# Patient Record
Sex: Male | Born: 1940 | Race: White | Hispanic: No | Marital: Married | Smoking: Never smoker
Health system: Southern US, Community
[De-identification: ages and names within clinical notes are randomized; demographics above are authoritative.]

## PROBLEM LIST (undated history)

## (undated) DIAGNOSIS — C801 Malignant (primary) neoplasm, unspecified: Secondary | ICD-10-CM

## (undated) DIAGNOSIS — C61 Malignant neoplasm of prostate: Secondary | ICD-10-CM

## (undated) DIAGNOSIS — N400 Enlarged prostate without lower urinary tract symptoms: Secondary | ICD-10-CM

## (undated) DIAGNOSIS — I639 Cerebral infarction, unspecified: Secondary | ICD-10-CM

## (undated) DIAGNOSIS — I10 Essential (primary) hypertension: Secondary | ICD-10-CM

## (undated) DIAGNOSIS — C189 Malignant neoplasm of colon, unspecified: Secondary | ICD-10-CM

## (undated) DIAGNOSIS — K219 Gastro-esophageal reflux disease without esophagitis: Secondary | ICD-10-CM

## (undated) DIAGNOSIS — E785 Hyperlipidemia, unspecified: Secondary | ICD-10-CM

## (undated) DIAGNOSIS — R42 Dizziness and giddiness: Secondary | ICD-10-CM

## (undated) DIAGNOSIS — H9190 Unspecified hearing loss, unspecified ear: Secondary | ICD-10-CM

## (undated) DIAGNOSIS — F419 Anxiety disorder, unspecified: Secondary | ICD-10-CM

## (undated) DIAGNOSIS — L409 Psoriasis, unspecified: Secondary | ICD-10-CM

## (undated) DIAGNOSIS — M1990 Unspecified osteoarthritis, unspecified site: Secondary | ICD-10-CM

## (undated) HISTORY — PX: COLONOSCOPY: SHX174

## (undated) HISTORY — PX: HEMORRHOID SURGERY: SHX153

## (undated) HISTORY — PX: COLON SURGERY: SHX602

## (undated) HISTORY — PX: CIRCUMCISION: SUR203

## (undated) HISTORY — PX: TONSILLECTOMY: SUR1361

## (undated) HISTORY — PX: HERNIA REPAIR: SHX51

## (undated) HISTORY — PX: APPENDECTOMY: SHX54

## (undated) HISTORY — DX: Malignant neoplasm of colon, unspecified: C18.9

## (undated) HISTORY — PX: COLONOSCOPY W/ POLYPECTOMY: SHX1380

---

## 1998-01-13 HISTORY — PX: HEMORRHOID SURGERY: SHX153

## 2000-01-14 HISTORY — PX: HERNIA REPAIR: SHX51

## 2014-09-22 DIAGNOSIS — C4491 Basal cell carcinoma of skin, unspecified: Secondary | ICD-10-CM

## 2014-09-22 HISTORY — DX: Basal cell carcinoma of skin, unspecified: C44.91

## 2015-06-20 ENCOUNTER — Ambulatory Visit: Payer: Self-pay | Admitting: General Surgery

## 2015-06-22 DIAGNOSIS — C4492 Squamous cell carcinoma of skin, unspecified: Secondary | ICD-10-CM

## 2015-06-22 HISTORY — DX: Squamous cell carcinoma of skin, unspecified: C44.92

## 2015-12-24 ENCOUNTER — Encounter (HOSPITAL_COMMUNITY)
Admission: RE | Admit: 2015-12-24 | Discharge: 2015-12-24 | Disposition: A | Discharge status: Home / Self Care | Payer: Medicare Other | Source: Ambulatory Visit | Attending: General Surgery | Admitting: General Surgery

## 2015-12-24 ENCOUNTER — Encounter (HOSPITAL_COMMUNITY): Payer: Self-pay

## 2015-12-24 DIAGNOSIS — Z01812 Encounter for preprocedural laboratory examination: Secondary | ICD-10-CM | POA: Diagnosis present

## 2015-12-24 DIAGNOSIS — R9431 Abnormal electrocardiogram [ECG] [EKG]: Secondary | ICD-10-CM | POA: Diagnosis not present

## 2015-12-24 DIAGNOSIS — Z01810 Encounter for preprocedural cardiovascular examination: Secondary | ICD-10-CM | POA: Diagnosis present

## 2015-12-24 HISTORY — DX: Psoriasis, unspecified: L40.9

## 2015-12-24 HISTORY — DX: Dizziness and giddiness: R42

## 2015-12-24 HISTORY — DX: Gastro-esophageal reflux disease without esophagitis: K21.9

## 2015-12-24 HISTORY — DX: Unspecified hearing loss, unspecified ear: H91.90

## 2015-12-24 HISTORY — DX: Hyperlipidemia, unspecified: E78.5

## 2015-12-24 HISTORY — DX: Cerebral infarction, unspecified: I63.9

## 2015-12-24 HISTORY — DX: Benign prostatic hyperplasia without lower urinary tract symptoms: N40.0

## 2015-12-24 HISTORY — DX: Essential (primary) hypertension: I10

## 2015-12-24 HISTORY — DX: Anxiety disorder, unspecified: F41.9

## 2015-12-24 HISTORY — DX: Malignant (primary) neoplasm, unspecified: C80.1

## 2015-12-24 LAB — BASIC METABOLIC PANEL
Anion gap: 7 (ref 5–15)
BUN: 19 mg/dL (ref 6–20)
CHLORIDE: 104 mmol/L (ref 101–111)
CO2: 29 mmol/L (ref 22–32)
CREATININE: 1.13 mg/dL (ref 0.61–1.24)
Calcium: 9.5 mg/dL (ref 8.9–10.3)
GFR calc Af Amer: >60 mL/min (ref >60)
GLUCOSE: 114 mg/dL — AB (ref 65–99)
POTASSIUM: 3.8 mmol/L (ref 3.5–5.1)
Sodium: 140 mmol/L (ref 135–145)

## 2015-12-24 LAB — CBC
HEMATOCRIT: 41.3 % (ref 39.0–52.0)
Hemoglobin: 13.9 g/dL (ref 13.0–17.0)
MCH: 31.2 pg (ref 26.0–34.0)
MCHC: 33.7 g/dL (ref 30.0–36.0)
MCV: 92.8 fL (ref 78.0–100.0)
Platelets: 169 10*3/uL (ref 150–400)
RBC: 4.45 MIL/uL (ref 4.22–5.81)
RDW: 12.4 % (ref 11.5–15.5)
WBC: 4.8 10*3/uL (ref 4.0–10.5)

## 2015-12-24 NOTE — Pre-Procedure Instructions (Signed)
    Ferd Musa  12/24/2015     Your procedure is scheduled on Monday, December 18.  Report to [facility] Admitting at [place] AM               Your surgery or procedure is scheduled for 7:30 AM   Call this number if you have problems the morning of surgery: [phone]                For any other questions, please call [phone], Monday - Friday 8 AM - 4 PM.  Remember:  Do not eat food or drink liquids after midnight Sunday, December 17.  Take these medicines the morning of surgery with A SIP OF WATER: diltiazem (DILACOR XR), doxazosin (CARDURA).               1 Week prior to surgery STOP taking Aspirin, Aspirin Products (Goody Powder, Excedrin Migraine), Ibuprofen (Advil), Naproxen (Aleve), Vitamins and Herbal Products (ie Fish Oil)   Do not wear jewelry, make-up or nail polish.  Do not wear lotions, powders, or perfumes, or deoderant.  Men may shave face and neck.  Do not bring valuables to the hospital.  [facility] is not responsible for any belongings or valuables.  Contacts, dentures or bridgework may not be worn into surgery.  Leave your suitcase in the car.  After surgery it may be brought to your room.  For patients admitted to the hospital, discharge time will be determined by your treatment team.  Patients discharged the day of surgery will not be allowed to drive home.   Name and phone number of your driver: -  Special instructions:  Review  [place] - Preparing For Surgery.  Please read over the following fact sheets that you were given. [place]- Preparing For Surger , Coughing and Deep BreathingPain Booklet

## 2015-12-24 NOTE — Progress Notes (Signed)
   12/24/15 1056  OBSTRUCTIVE SLEEP APNEA  Have you ever been diagnosed with sleep apnea through a sleep study? No  Do you snore loudly (loud enough to be heard through closed doors)?  1 (sometimes)  Do you often feel tired, fatigued, or sleepy during the daytime (such as falling asleep during driving or talking to someone)? 1  Has anyone observed you stop breathing during your sleep? 0  Do you have, or are you being treated for high blood pressure? 1  BMI more than 35 kg/m2? 0  Age > 50 (1-yes) 1  Neck circumference greater than:Male 16 inches or larger, Male 17inches or larger? 0 (15.5)  Male Gender (Yes=1) 1  Obstructive Sleep Apnea Score 5  Score 5 or greater  Results sent to PCP

## 2015-12-24 NOTE — Progress Notes (Signed)
Mr Painter denies shortness of breath at rest or chest pain. PCP is at [facility], I requested records. Denies Echo or Stress test.

## 2015-12-25 ENCOUNTER — Encounter (HOSPITAL_COMMUNITY): Payer: Self-pay

## 2015-12-26 ENCOUNTER — Ambulatory Visit: Payer: Self-pay | Admitting: General Surgery

## 2015-12-31 ENCOUNTER — Ambulatory Visit (HOSPITAL_COMMUNITY)
Admission: RE | Admit: 2015-12-31 | Discharge: 2015-12-31 | Disposition: A | Discharge status: Home / Self Care | Payer: Medicare Other | Source: Ambulatory Visit | Attending: General Surgery | Admitting: General Surgery

## 2015-12-31 ENCOUNTER — Encounter (HOSPITAL_COMMUNITY): Payer: Self-pay | Admitting: Anesthesiology

## 2015-12-31 ENCOUNTER — Encounter (HOSPITAL_COMMUNITY)
Admission: RE | Disposition: A | Discharge status: Home / Self Care | Payer: Self-pay | Source: Ambulatory Visit | Attending: General Surgery

## 2015-12-31 ENCOUNTER — Ambulatory Visit (HOSPITAL_COMMUNITY): Payer: Medicare Other | Admitting: Emergency Medicine

## 2015-12-31 ENCOUNTER — Ambulatory Visit (HOSPITAL_COMMUNITY): Payer: Medicare Other | Admitting: Anesthesiology

## 2015-12-31 DIAGNOSIS — Z79899 Other long term (current) drug therapy: Secondary | ICD-10-CM | POA: Diagnosis not present

## 2015-12-31 DIAGNOSIS — K219 Gastro-esophageal reflux disease without esophagitis: Secondary | ICD-10-CM | POA: Diagnosis not present

## 2015-12-31 DIAGNOSIS — I10 Essential (primary) hypertension: Secondary | ICD-10-CM | POA: Insufficient documentation

## 2015-12-31 DIAGNOSIS — F419 Anxiety disorder, unspecified: Secondary | ICD-10-CM | POA: Diagnosis not present

## 2015-12-31 DIAGNOSIS — K4090 Unilateral inguinal hernia, without obstruction or gangrene, not specified as recurrent: Secondary | ICD-10-CM | POA: Diagnosis present

## 2015-12-31 HISTORY — PX: INGUINAL HERNIA REPAIR: SHX194

## 2015-12-31 HISTORY — PX: INSERTION OF MESH: SHX5868

## 2015-12-31 SURGERY — REPAIR, HERNIA, INGUINAL, ADULT
Anesthesia: Regional | Site: Groin | Laterality: Left

## 2015-12-31 MED ORDER — FENTANYL CITRATE (PF) 100 MCG/2ML IJ SOLN: 25.0000 ug | INTRAMUSCULAR | Status: DC | PRN

## 2015-12-31 MED ORDER — SUGAMMADEX SODIUM 200 MG/2ML IV SOLN
INTRAVENOUS | Status: DC | PRN
  Administered 2015-12-31: 150 mg via INTRAVENOUS

## 2015-12-31 MED ORDER — 0.9 % SODIUM CHLORIDE (POUR BTL) OPTIME
TOPICAL | Status: DC | PRN
  Administered 2015-12-31: 1000 mL

## 2015-12-31 MED ORDER — PHENYLEPHRINE 40 MCG/ML (10ML) SYRINGE FOR IV PUSH (FOR BLOOD PRESSURE SUPPORT)
PREFILLED_SYRINGE | INTRAVENOUS | Status: AC
  Filled 2015-12-31: qty 10

## 2015-12-31 MED ORDER — ONDANSETRON HCL 4 MG/2ML IJ SOLN
INTRAMUSCULAR | Status: AC
  Filled 2015-12-31: qty 2

## 2015-12-31 MED ORDER — FENTANYL CITRATE (PF) 100 MCG/2ML IJ SOLN
INTRAMUSCULAR | Status: DC | PRN
  Administered 2015-12-31: 50 ug via INTRAVENOUS
  Administered 2015-12-31: 100 ug via INTRAVENOUS

## 2015-12-31 MED ORDER — PHENYLEPHRINE HCL 10 MG/ML IJ SOLN
INTRAMUSCULAR | Status: DC | PRN
  Administered 2015-12-31 (×4): 80 ug via INTRAVENOUS

## 2015-12-31 MED ORDER — SUGAMMADEX SODIUM 200 MG/2ML IV SOLN
INTRAVENOUS | Status: AC
  Filled 2015-12-31: qty 2

## 2015-12-31 MED ORDER — FENTANYL CITRATE (PF) 100 MCG/2ML IJ SOLN
INTRAMUSCULAR | Status: AC
  Filled 2015-12-31: qty 4

## 2015-12-31 MED ORDER — CHLORHEXIDINE GLUCONATE CLOTH 2 % EX PADS: 6.0000 | MEDICATED_PAD | Freq: Once | CUTANEOUS | Status: DC

## 2015-12-31 MED ORDER — MIDAZOLAM HCL 5 MG/5ML IJ SOLN
INTRAMUSCULAR | Status: DC | PRN
  Administered 2015-12-31: 1 mg via INTRAVENOUS

## 2015-12-31 MED ORDER — LIDOCAINE HCL (CARDIAC) 20 MG/ML IV SOLN
INTRAVENOUS | Status: DC | PRN
  Administered 2015-12-31: 60 mg via INTRAVENOUS

## 2015-12-31 MED ORDER — OXYCODONE HCL 5 MG PO TABS: 5.0000 mg | ORAL_TABLET | Freq: Four times a day (QID) | ORAL | 0 refills | Status: DC | PRN

## 2015-12-31 MED ORDER — ONDANSETRON HCL 4 MG/2ML IJ SOLN
INTRAMUSCULAR | Status: DC | PRN
  Administered 2015-12-31: 4 mg via INTRAVENOUS

## 2015-12-31 MED ORDER — PROPOFOL 10 MG/ML IV BOLUS
INTRAVENOUS | Status: DC | PRN
  Administered 2015-12-31: 150 mg via INTRAVENOUS

## 2015-12-31 MED ORDER — BUPIVACAINE-EPINEPHRINE (PF) 0.25% -1:200000 IJ SOLN
INTRAMUSCULAR | Status: AC
  Filled 2015-12-31: qty 30

## 2015-12-31 MED ORDER — CIPROFLOXACIN IN D5W 400 MG/200ML IV SOLN
400.0000 mg | INTRAVENOUS | Status: AC
  Administered 2015-12-31: 400 mg via INTRAVENOUS
  Filled 2015-12-31: qty 200

## 2015-12-31 MED ORDER — PROPOFOL 10 MG/ML IV BOLUS
INTRAVENOUS | Status: AC
  Filled 2015-12-31: qty 20

## 2015-12-31 MED ORDER — BUPIVACAINE-EPINEPHRINE 0.25% -1:200000 IJ SOLN
INTRAMUSCULAR | Status: DC | PRN
  Administered 2015-12-31: 10 mL

## 2015-12-31 MED ORDER — LIDOCAINE 2% (20 MG/ML) 5 ML SYRINGE
INTRAMUSCULAR | Status: AC
  Filled 2015-12-31: qty 5

## 2015-12-31 MED ORDER — EPHEDRINE SULFATE 50 MG/ML IJ SOLN
INTRAMUSCULAR | Status: DC | PRN
  Administered 2015-12-31 (×2): 10 mg via INTRAVENOUS

## 2015-12-31 MED ORDER — ROCURONIUM BROMIDE 50 MG/5ML IV SOSY
PREFILLED_SYRINGE | INTRAVENOUS | Status: AC
  Filled 2015-12-31: qty 5

## 2015-12-31 MED ORDER — ROCURONIUM BROMIDE 100 MG/10ML IV SOLN
INTRAVENOUS | Status: DC | PRN
  Administered 2015-12-31: 40 mg via INTRAVENOUS

## 2015-12-31 MED ORDER — LACTATED RINGERS IV SOLN
INTRAVENOUS | Status: DC | PRN
  Administered 2015-12-31 (×2): via INTRAVENOUS

## 2015-12-31 MED ORDER — MIDAZOLAM HCL 2 MG/2ML IJ SOLN
INTRAMUSCULAR | Status: AC
  Filled 2015-12-31: qty 2

## 2015-12-31 SURGICAL SUPPLY — 43 items
BLADE SURG 10 STRL SS (BLADE) ×3 IMPLANT
BLADE SURG 15 STRL LF DISP TIS (BLADE) ×1 IMPLANT
BLADE SURG 15 STRL SS (BLADE) ×2
BLADE SURG ROTATE 9660 (MISCELLANEOUS) ×3 IMPLANT
CHLORAPREP W/TINT 26ML (MISCELLANEOUS) ×3 IMPLANT
COVER SURGICAL LIGHT HANDLE (MISCELLANEOUS) ×3 IMPLANT
DERMABOND ADVANCED (GAUZE/BANDAGES/DRESSINGS) ×2
DERMABOND ADVANCED .7 DNX12 (GAUZE/BANDAGES/DRESSINGS) ×1 IMPLANT
DRAIN PENROSE 1/2X12 LTX STRL (WOUND CARE) ×3 IMPLANT
DRAPE LAPAROTOMY 100X72 PEDS (DRAPES) ×3 IMPLANT
DRAPE UTILITY XL STRL (DRAPES) ×3 IMPLANT
ELECT CAUTERY BLADE 6.4 (BLADE) ×3 IMPLANT
ELECT REM PT RETURN 9FT ADLT (ELECTROSURGICAL) ×3
ELECTRODE REM PT RTRN 9FT ADLT (ELECTROSURGICAL) ×1 IMPLANT
GLOVE BIO SURGEON STRL SZ8 (GLOVE) ×3 IMPLANT
GLOVE BIOGEL PI IND STRL 8 (GLOVE) ×1 IMPLANT
GLOVE BIOGEL PI INDICATOR 8 (GLOVE) ×2
GOWN STRL REUS W/ TWL LRG LVL3 (GOWN DISPOSABLE) ×1 IMPLANT
GOWN STRL REUS W/ TWL XL LVL3 (GOWN DISPOSABLE) ×1 IMPLANT
GOWN STRL REUS W/TWL LRG LVL3 (GOWN DISPOSABLE) ×2
GOWN STRL REUS W/TWL XL LVL3 (GOWN DISPOSABLE) ×2
KIT BASIN OR (CUSTOM PROCEDURE TRAY) ×3 IMPLANT
KIT ROOM TURNOVER OR (KITS) ×3 IMPLANT
MESH HERNIA 3X6 (Mesh General) ×3 IMPLANT
NEEDLE 22X1 1/2 (OR ONLY) (NEEDLE) ×3 IMPLANT
NS IRRIG 1000ML POUR BTL (IV SOLUTION) ×3 IMPLANT
PACK SURGICAL SETUP 50X90 (CUSTOM PROCEDURE TRAY) ×3 IMPLANT
PAD ARMBOARD 7.5X6 YLW CONV (MISCELLANEOUS) ×3 IMPLANT
PENCIL BUTTON HOLSTER BLD 10FT (ELECTRODE) ×3 IMPLANT
SPONGE LAP 18X18 X RAY DECT (DISPOSABLE) ×3 IMPLANT
SUT MNCRL AB 4-0 PS2 18 (SUTURE) ×3 IMPLANT
SUT PROLENE 2 0 CT2 30 (SUTURE) ×12 IMPLANT
SUT SILK 3 0 TIES 10X30 (SUTURE) ×3 IMPLANT
SUT VIC AB 2-0 SH 27 (SUTURE) ×4
SUT VIC AB 2-0 SH 27X BRD (SUTURE) ×2 IMPLANT
SUT VIC AB 3-0 SH 27 (SUTURE) ×2
SUT VIC AB 3-0 SH 27X BRD (SUTURE) ×1 IMPLANT
SYR BULB 3OZ (MISCELLANEOUS) ×3 IMPLANT
SYR CONTROL 10ML LL (SYRINGE) ×3 IMPLANT
TOWEL OR 17X24 6PK STRL BLUE (TOWEL DISPOSABLE) ×3 IMPLANT
TUBE CONNECTING 12'X1/4 (SUCTIONS) ×1
TUBE CONNECTING 12X1/4 (SUCTIONS) ×2 IMPLANT
YANKAUER SUCT BULB TIP NO VENT (SUCTIONS) ×3 IMPLANT

## 2015-12-31 NOTE — Op Note (Signed)
12/31/2015  8:32 AM  PATIENT:  Chad Ford  75 y.o. male  PRE-OPERATIVE DIAGNOSIS:  LEFT INGUINAL HERNIA  POST-OPERATIVE DIAGNOSIS:  LEFT INGUINAL HERNIA  PROCEDURE:  Procedure(s): HERNIA REPAIR LEFT INGUINAL ADULT INSERTION OF MESH  SURGEON:  Surgeon(s): Georganna Skeans, MD  ASSISTANTS: none   ANESTHESIA:   local, regional and general  EBL:  Total I/O In: 1300 [I.V.:1300] Out: -   BLOOD ADMINISTERED:none  DRAINS: none   SPECIMEN:  No Specimen  DISPOSITION OF SPECIMEN:  N/A  COUNTS:  YES  DICTATION: .Dragon Dictation Findings: Large direct hernia  Procedure in detail: Dace presents for repair of left inguinal hernia with mesh. He was identified in the preop holding area. Informed consent was obtained. He underwent a tap block by anesthesia. He was brought to the operating room and general endotracheal anesthesia was administered per the anesthesia staff. His abdomen was prepped and draped in sterile fashion. Time out procedure was performed. Planned line of incision in the left inguinal region was treated with local. Left inguinal incision was made. Subcutaneous tissues were dissected revealing the external oblique fascia. Hemostasis was obtained dissection with cautery. External oblique fascia was divided laterally in the division was continued down medially over the hernia. Superior leaflet of the external oblique was dissected off the transversalis. Inferior leaflet was dissected down revealing the shelving edge of inguinal ligament. Dissection of the cord structures revealed the hernia to be a large direct hernia. This easily reduced back into the abdomen. Several interrupted 2-0 Vicryl sutures were placed between the transversalis and the shelving edge of inguinal ligament to keep the direct hernia reduced while the repair was completed. Repair was done with a keyhole polypropylene mesh cut to custom size and shape. This was tacked to the tissues over the pubic  tubercle medially with 2-0 Prolene. Running 2-0 Prolene was used to sew it to the shelving edge of inguinal ligament inferiorly. Superiorly multiple interrupted 2-0 Prolene's were used to tack the mesh down to the transversalis and the tissues over the pubic tubercle. The 2 leaflets of the mesh were joined together with 2-0 Prolene tacked down underlying musculature. The leaflets were placed out laterally underneath the external oblique fascia. The keyhole in the mesh was adjusted just the tip of the fifth digit. Area was copiously irrigated. Hemostasis was obtained. External oblique fascia was closed with running 2-0 Vicryl. Scarpa's fascia was closed with interrupted 3-0 Vicryl. Hemostasis was ensured and the skin was closed with running 4-0 Monocryl subcuticular followed by Dermabond. All counts were correct. He tolerated the procedure well without apparent complication. Left testicle was relocated to anatomic position in the scrotum. He was taken recovery in stable condition. PATIENT DISPOSITION:  PACU - hemodynamically stable.   Delay start of Pharmacological VTE agent (>24hrs) due to surgical blood loss or risk of bleeding:  no  Georganna Skeans, MD, MPH, FACS Pager: [phone]  12/18/20178:32 AM

## 2015-12-31 NOTE — Anesthesia Procedure Notes (Signed)
Procedure Name: Intubation Date/Time: 12/31/2015 7:33 AM Performed by: Rebekah Chesterfield L Pre-anesthesia Checklist: Patient identified, Emergency Drugs available, Suction available and Patient being monitored Patient Re-evaluated:Patient Re-evaluated prior to inductionOxygen Delivery Method: Circle System Utilized Preoxygenation: Pre-oxygenation with 100% oxygen Intubation Type: IV induction Ventilation: Mask ventilation without difficulty Laryngoscope Size: Mac and 4 Grade View: Grade I Tube type: Oral Tube size: 7.5 mm Number of attempts: 1 Airway Equipment and Method: Stylet Placement Confirmation: ETT inserted through vocal cords under direct vision,  positive ETCO2 and breath sounds checked- equal and bilateral Secured at: 21 cm Tube secured with: Tape Dental Injury: Teeth and Oropharynx as per pre-operative assessment

## 2015-12-31 NOTE — Addendum Note (Signed)
Addendum  created 12/31/15 1826 by Belinda Block, MD   Anesthesia Intra Blocks edited, Child order released for a procedure order, Order Canceled from Note, Sign clinical note

## 2015-12-31 NOTE — Transfer of Care (Signed)
Immediate Anesthesia Transfer of Care Note  Patient: Chad Ford  Procedure(s) Performed: Procedure(s): HERNIA REPAIR INGUINAL ADULT (Left) INSERTION OF MESH (Left)  Patient Location: PACU  Anesthesia Type:GA combined with regional for post-op pain  Level of Consciousness: awake, alert , oriented and patient cooperative  Airway & Oxygen Therapy: Patient Spontanous Breathing and Patient connected to nasal cannula oxygen  Post-op Assessment: Report given to RN, Post -op Vital signs reviewed and stable and Patient moving all extremities  Post vital signs: Reviewed and stable  Last Vitals:  Vitals:   12/31/15 0703 12/31/15 0705  BP: (!) 101/55   Pulse:  (!) 50  Resp: 15 16  Temp:      Last Pain: There were no vitals filed for this visit.       Complications: No apparent anesthesia complications

## 2015-12-31 NOTE — Anesthesia Procedure Notes (Signed)
Anesthesia Procedure Note     

## 2015-12-31 NOTE — Anesthesia Procedure Notes (Signed)
Anesthesia Regional Block:  TAP block  Pre-Anesthetic Checklist: ,, timeout performed, Correct Patient, Correct Site, Correct Laterality, Correct Procedure, Correct Position, site marked, Risks and benefits discussed,  Surgical consent,  Pre-op evaluation,  At surgeon's request and post-op pain management  Laterality: Left  Prep: Dura Prep       Needles:   Needle Type: Stimulator Needle - 80          Additional Needles:  Procedures: Doppler guided and ultrasound guided (picture in chart) TAP block Narrative:  Start time: 12/31/2015 7:00 AM End time: 12/31/2015 7:15 AM Injection made incrementally with aspirations every 5 mL.  Performed by: Personally  Anesthesiologist: Belinda Block

## 2015-12-31 NOTE — H&P (Signed)
Chad Ford. Mireles 06/20/2015 10:20 AM Location: [facility] Patient #: [id number] DOB: 06-23-1940 Married / Language: English / Race: Refused to Report/Unreported Male   History of Present Illness Chad Neri E. Grandville Silos MD; 06/20/2015 10:38 AM) The patient is a 75 year old male who presents with an inguinal hernia. I was asked to see Chad Ford in consultation by Dr. Tollie Pizza regarding a left inguinal hernia. Chad Ford has had it for 3 or 4 years. It tends to go back in spontaneously at night when he lays down. Of note, he has had a right inguinal hernia repair previously around 2000 in [place]. It spontaneously recurred within a couple days and he needed another operation but since then it has been doing well. It is not his impression that any mesh was used at that time. No change in bowel or bladder habits.   Other Problems Davy Pique Bynum, CMA; 06/20/2015 10:20 AM) Anxiety Disorder  Arthritis  Cancer  Diverticulosis  Gastroesophageal Reflux Disease  Hemorrhoids  High blood pressure  Hypercholesterolemia  Inguinal Hernia   Past Surgical History Davy Pique Bynum, CMA; 06/20/2015 10:20 AM) Cataract Surgery  Bilateral. Colon Polyp Removal - Colonoscopy  Open Inguinal Hernia Surgery  Right. multiple Oral Surgery  Tonsillectomy  Vasectomy   Diagnostic Studies History Marjean Donna, CMA; 06/20/2015 10:20 AM) Colonoscopy  within last year  Allergies (Sonya Bynum, CMA; 06/20/2015 10:21 AM) PenicillAMINE *ASSORTED CLASSES*   Medication History (Sonya Bynum, CMA; 06/20/2015 10:22 AM) Benazepril HCl (40MG  Tablet, Oral) Active. Dilt-XR (180MG  Capsule ER 24HR, Oral) Active. HydroCHLOROthiazide (25MG  Tablet, Oral) Active. Vigamox (0.5% Solution, Ophthalmic) Active. Pravastatin Sodium (40MG  Tablet, Oral) Active. Doxazosin Mesylate (8MG  Tablet, Oral) Active. Ciclopirox Olamine (0.77% Cream, External) Active. Medications Reconciled  Social History Marjean Donna, CMA;  06/20/2015 10:20 AM) Alcohol use  Occasional alcohol use. Caffeine use  Coffee. No drug use  Tobacco use  Never smoker.  Family History Marjean Donna, [place]; 06/20/2015 10:20 AM) Alcohol Abuse  Son. Diabetes Mellitus  Daughter. Hypertension  Father.    Review of Systems (Sonya Bynum CMA; 06/20/2015 10:20 AM) General Not Present- Appetite Loss, Chills, Fatigue, Fever, Night Sweats, Weight Gain and Weight Loss. HEENT Present- Hearing Loss and Seasonal Allergies. Not Present- Earache, Hoarseness, Nose Bleed, Oral Ulcers, Ringing in the Ears, Sinus Pain, Sore Throat, Visual Disturbances, Wears glasses/contact lenses and Yellow Eyes. Respiratory Not Present- Bloody sputum, Chronic Cough, Difficulty Breathing, Snoring and Wheezing. Breast Not Present- Breast Mass, Breast Pain, Nipple Discharge and Skin Changes. Cardiovascular Not Present- Chest Pain, Difficulty Breathing Lying Down, Leg Cramps, Palpitations, Rapid Heart Rate, Shortness of Breath and Swelling of Extremities. Gastrointestinal Present- Excessive gas. Not Present- Abdominal Pain, Bloating, Bloody Stool, Change in Bowel Habits, Chronic diarrhea, Constipation, Difficulty Swallowing, Gets full quickly at meals, Hemorrhoids, Indigestion, Nausea, Rectal Pain and Vomiting. Male Genitourinary Present- Impotence. Not Present- Blood in Urine, Change in Urinary Stream, Frequency, Nocturia, Painful Urination, Urgency and Urine Leakage. Musculoskeletal Present- Joint Pain. Not Present- Back Pain, Joint Stiffness, Muscle Pain, Muscle Weakness and Swelling of Extremities. Psychiatric Present- Anxiety. Not Present- Bipolar, Change in Sleep Pattern, Depression, Fearful and Frequent crying. Endocrine Not Present- Cold Intolerance, Excessive Hunger, Hair Changes, Heat Intolerance, Hot flashes and New Diabetes. Hematology Present- Easy Bruising. Not Present- Excessive bleeding, Gland problems, HIV and Persistent Infections.  Vitals (Sonya Bynum CMA;  06/20/2015 10:21 AM) 06/20/2015 10:21 AM Weight: 160 lb Height: 68in Body Surface Area: 1.86 m Body Mass Index: 24.33 kg/m  Temp.: 6F(Temporal)  Pulse: 78 (Regular)  BP: 126/80 (Sitting, Left  Arm, Standard)       Physical Exam Chad Neri E. Grandville Silos MD; 06/20/2015 10:39 AM) General Mental Status-Alert. General Appearance-Consistent with stated age. Hydration-Well hydrated. Voice-Normal.  Head and Neck Head-normocephalic, atraumatic with no lesions or palpable masses. Trachea-midline. Thyroid Gland Characteristics - normal size and consistency.  Eye Eyeball - Bilateral-Extraocular movements intact. Sclera/Conjunctiva - Bilateral-No scleral icterus.  Chest and Lung Exam Chest and lung exam reveals -quiet, even and easy respiratory effort with no use of accessory muscles and on auscultation, normal breath sounds, no adventitious sounds and normal vocal resonance. Inspection Chest Wall - Normal. Back - normal.  Cardiovascular Cardiovascular examination reveals -normal heart sounds, regular rate and rhythm with no murmurs and normal pedal pulses bilaterally.  Abdomen Inspection Inspection of the abdomen reveals - No Hernias. Palpation/Percussion Palpation and Percussion of the abdomen reveal - Soft, Non Tender, No Rebound tenderness, No Rigidity (guarding) and No hepatosplenomegaly. Auscultation Auscultation of the abdomen reveals - Bowel sounds normal.  Male Genitourinary Note: Right inguinal scar, no evidence of right inguinal hernia, moderate-sized left inguinal hernia but does not extend into the scrotum, easily reduces, both testicles are descended and nontender nor edematous   Neurologic Neurologic evaluation reveals -alert and oriented x 3 with no impairment of recent or remote memory. Mental Status-Normal.  Musculoskeletal Global Assessment -Note: no gross deformities.  Normal Exam - Left-Upper Extremity Strength Normal  and Lower Extremity Strength Normal. Normal Exam - Right-Upper Extremity Strength Normal and Lower Extremity Strength Normal.  Lymphatic Head & Neck  General Head & Neck Lymphatics: Bilateral - Description - Normal. Axillary  General Axillary Region: Bilateral - Description - Normal. Tenderness - Non Tender. Femoral & Inguinal  Generalized Femoral & Inguinal Lymphatics: Bilateral - Description - No Generalized lymphadenopathy.    Assessment & Plan Chad Neri E. Grandville Silos MD; 06/20/2015 10:40 AM) INGUINAL HERNIA OF LEFT SIDE WITHOUT OBSTRUCTION OR GANGRENE (K40.90) Impression: I preferred outpatient repair as an open approach using mesh. Procedure, risks, and benefits were discussed in detail with him. 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 He will need to avoid any heavy lifting for a total of 6 weeks after surgery. He will call us in the interim before scheduling should he develop any worsening symptoms. He is agreeable.  12/31/15 update: Symptoms have been the same A&O Lngs CTA CV Reg Abd soft, NT LIH remains very similar to previous exam. For repair [facility] with mesh today. I discussed the expected post-op course.  Georganna Skeans, MD, MPH, FACS Trauma: [phone] General Surgery: [phone]

## 2015-12-31 NOTE — Interval H&P Note (Signed)
History and Physical Interval Note:  12/31/2015 6:52 AM  Chad Ford  has presented today for surgery, with the diagnosis of LEFT INGUINAL HERNIA  The various methods of treatment have been discussed with the patient and family. After consideration of risks, benefits and other options for treatment, the patient has consented to  Procedure(s): HERNIA REPAIR INGUINAL ADULT (Left) INSERTION OF MESH (Left) as a surgical intervention .  The patient's history has been reviewed, patient examined, no change in status, stable for surgery.  Site marked. I have reviewed the patient's chart and labs.  Questions were answered to the patient's satisfaction.     Senaida Chilcote E

## 2015-12-31 NOTE — Anesthesia Postprocedure Evaluation (Signed)
Anesthesia Post Note  Patient: Chad Ford  Procedure(s) Performed: Procedure(s) (LRB): HERNIA REPAIR INGUINAL ADULT (Left) INSERTION OF MESH (Left)  Anesthesia Post Evaluation     Last Vitals:  Vitals:   12/31/15 1000 12/31/15 1008  BP: (!) 95/42 93/60  Pulse: (!) 48 (!) 46  Resp: 14 16  Temp: 36.6 C     Last Pain:  Vitals:   12/31/15 1008  PainSc: 0-No pain                 Jerni Selmer

## 2015-12-31 NOTE — Progress Notes (Signed)
Pt up to br voided w/o diffuculty qs has drank ginger ale w/o nausea

## 2015-12-31 NOTE — Anesthesia Postprocedure Evaluation (Signed)
Anesthesia Post Note  Patient: Chad Ford  Procedure(s) Performed: Procedure(s) (LRB): HERNIA REPAIR INGUINAL ADULT (Left) INSERTION OF MESH (Left)  Patient location during evaluation: PACU Anesthesia Type: General Level of consciousness: awake Pain management: pain level controlled Vital Signs Assessment: post-procedure vital signs reviewed and stable Respiratory status: spontaneous breathing Cardiovascular status: stable Anesthetic complications: no    Last Vitals:  Vitals:   12/31/15 1000 12/31/15 1008  BP: (!) 95/42 93/60  Pulse: (!) 48 (!) 46  Resp: 14 16  Temp: 36.6 C     Last Pain:  Vitals:   12/31/15 1008  PainSc: 0-No pain                 Shakthi Scipio

## 2015-12-31 NOTE — Anesthesia Preprocedure Evaluation (Signed)
Anesthesia Evaluation  Patient identified by MRN, date of birth, ID band Patient awake    Reviewed: Allergy & Precautions, NPO status , Patient's Chart, lab work & pertinent test results  History of Anesthesia Complications (+) POST - OP SPINAL HEADACHE  Airway Mallampati: II  TM Distance: >3 FB     Dental   Pulmonary    breath sounds clear to auscultation       Cardiovascular hypertension,  Rhythm:Regular Rate:Normal     Neuro/Psych    GI/Hepatic Neg liver ROS, GERD  ,  Endo/Other  negative endocrine ROS  Renal/GU negative Renal ROS     Musculoskeletal   Abdominal   Peds  Hematology   Anesthesia Other Findings   Reproductive/Obstetrics                             Anesthesia Physical Anesthesia Plan  ASA: III  Anesthesia Plan: General and Regional   Post-op Pain Management:    Induction: Intravenous  Airway Management Planned: LMA  Additional Equipment:   Intra-op Plan:   Post-operative Plan: Extubation in OR  Informed Consent:   Dental advisory given  Plan Discussed with: CRNA and Anesthesiologist  Anesthesia Plan Comments:         Anesthesia Quick Evaluation

## 2016-01-01 ENCOUNTER — Encounter (HOSPITAL_COMMUNITY): Payer: Self-pay | Admitting: General Surgery

## 2017-02-06 DIAGNOSIS — D229 Melanocytic nevi, unspecified: Secondary | ICD-10-CM

## 2017-02-06 HISTORY — DX: Melanocytic nevi, unspecified: D22.9

## 2018-03-04 DIAGNOSIS — Z961 Presence of intraocular lens: Secondary | ICD-10-CM | POA: Diagnosis not present

## 2018-05-20 DIAGNOSIS — I10 Essential (primary) hypertension: Secondary | ICD-10-CM | POA: Diagnosis not present

## 2018-05-20 DIAGNOSIS — E7800 Pure hypercholesterolemia, unspecified: Secondary | ICD-10-CM | POA: Diagnosis not present

## 2018-05-25 DIAGNOSIS — N528 Other male erectile dysfunction: Secondary | ICD-10-CM | POA: Diagnosis not present

## 2018-05-25 DIAGNOSIS — N400 Enlarged prostate without lower urinary tract symptoms: Secondary | ICD-10-CM | POA: Diagnosis not present

## 2018-05-25 DIAGNOSIS — E7800 Pure hypercholesterolemia, unspecified: Secondary | ICD-10-CM | POA: Diagnosis not present

## 2018-05-25 DIAGNOSIS — R739 Hyperglycemia, unspecified: Secondary | ICD-10-CM | POA: Diagnosis not present

## 2018-05-25 DIAGNOSIS — I10 Essential (primary) hypertension: Secondary | ICD-10-CM | POA: Diagnosis not present

## 2018-11-25 DIAGNOSIS — R739 Hyperglycemia, unspecified: Secondary | ICD-10-CM | POA: Diagnosis not present

## 2018-11-25 DIAGNOSIS — E7800 Pure hypercholesterolemia, unspecified: Secondary | ICD-10-CM | POA: Diagnosis not present

## 2018-11-25 DIAGNOSIS — N400 Enlarged prostate without lower urinary tract symptoms: Secondary | ICD-10-CM | POA: Diagnosis not present

## 2018-11-25 DIAGNOSIS — Z0000 Encounter for general adult medical examination without abnormal findings: Secondary | ICD-10-CM | POA: Diagnosis not present

## 2018-11-25 DIAGNOSIS — N528 Other male erectile dysfunction: Secondary | ICD-10-CM | POA: Diagnosis not present

## 2018-11-25 DIAGNOSIS — I10 Essential (primary) hypertension: Secondary | ICD-10-CM | POA: Diagnosis not present

## 2019-03-07 DIAGNOSIS — H26493 Other secondary cataract, bilateral: Secondary | ICD-10-CM | POA: Diagnosis not present

## 2019-03-07 DIAGNOSIS — Z961 Presence of intraocular lens: Secondary | ICD-10-CM | POA: Diagnosis not present

## 2019-03-30 DIAGNOSIS — R351 Nocturia: Secondary | ICD-10-CM | POA: Diagnosis not present

## 2019-03-30 DIAGNOSIS — R3912 Poor urinary stream: Secondary | ICD-10-CM | POA: Diagnosis not present

## 2019-03-30 DIAGNOSIS — N401 Enlarged prostate with lower urinary tract symptoms: Secondary | ICD-10-CM | POA: Diagnosis not present

## 2019-03-30 DIAGNOSIS — R972 Elevated prostate specific antigen [PSA]: Secondary | ICD-10-CM | POA: Diagnosis not present

## 2019-05-25 DIAGNOSIS — E7800 Pure hypercholesterolemia, unspecified: Secondary | ICD-10-CM | POA: Diagnosis not present

## 2019-05-25 DIAGNOSIS — N529 Male erectile dysfunction, unspecified: Secondary | ICD-10-CM | POA: Diagnosis not present

## 2019-05-25 DIAGNOSIS — Z125 Encounter for screening for malignant neoplasm of prostate: Secondary | ICD-10-CM | POA: Diagnosis not present

## 2019-05-25 DIAGNOSIS — R739 Hyperglycemia, unspecified: Secondary | ICD-10-CM | POA: Diagnosis not present

## 2019-05-25 DIAGNOSIS — Z0000 Encounter for general adult medical examination without abnormal findings: Secondary | ICD-10-CM | POA: Diagnosis not present

## 2019-05-25 DIAGNOSIS — I10 Essential (primary) hypertension: Secondary | ICD-10-CM | POA: Diagnosis not present

## 2019-05-25 DIAGNOSIS — R972 Elevated prostate specific antigen [PSA]: Secondary | ICD-10-CM | POA: Diagnosis not present

## 2019-06-16 ENCOUNTER — Encounter: Payer: Self-pay | Admitting: Physician Assistant

## 2019-06-16 ENCOUNTER — Ambulatory Visit (INDEPENDENT_AMBULATORY_CARE_PROVIDER_SITE_OTHER): Payer: PPO | Admitting: Physician Assistant

## 2019-06-16 ENCOUNTER — Other Ambulatory Visit: Payer: Self-pay

## 2019-06-16 DIAGNOSIS — Z85828 Personal history of other malignant neoplasm of skin: Secondary | ICD-10-CM

## 2019-06-16 DIAGNOSIS — C4491 Basal cell carcinoma of skin, unspecified: Secondary | ICD-10-CM

## 2019-06-16 DIAGNOSIS — Z86018 Personal history of other benign neoplasm: Secondary | ICD-10-CM | POA: Diagnosis not present

## 2019-06-16 DIAGNOSIS — L820 Inflamed seborrheic keratosis: Secondary | ICD-10-CM | POA: Diagnosis not present

## 2019-06-16 DIAGNOSIS — L570 Actinic keratosis: Secondary | ICD-10-CM | POA: Diagnosis not present

## 2019-06-16 DIAGNOSIS — Z87898 Personal history of other specified conditions: Secondary | ICD-10-CM

## 2019-06-16 DIAGNOSIS — Z1283 Encounter for screening for malignant neoplasm of skin: Secondary | ICD-10-CM

## 2019-06-16 DIAGNOSIS — D485 Neoplasm of uncertain behavior of skin: Secondary | ICD-10-CM | POA: Diagnosis not present

## 2019-06-16 DIAGNOSIS — C44619 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, including shoulder: Secondary | ICD-10-CM | POA: Diagnosis not present

## 2019-06-16 HISTORY — DX: Basal cell carcinoma of skin, unspecified: C44.91

## 2019-06-16 NOTE — Progress Notes (Signed)
   Follow up Visit  Subjective  Chad Ford is a 79 y.o. male who presents for the following: Annual Exam (Concerns top of scalp. ). Rough spots up on his scalp that grow and sometimes come off. There is one bump that is on the crown that is sore. He has put neosporin on it and it seems to help the soreness.   Objective  Well appearing patient in no apparent distress; mood and affect are within normal limits.  All skin waist up examined. No suspicious moles noted on back.   Objective  Left Forearm - Posterior (3), Left Supraorbital Region, Left Temporal Scalp (2), Mid Parietal Scalp (8), Right Eyebrow, Right Forearm - Posterior (4), Right Forehead: Erythematous patches with gritty scale.  Objective  Left Shoulder - Anterior: 5.5 cm to brown spot in photo     Objective  Left Upper Arm - Anterior: 15 cm to spot in photo     Assessment & Plan  AK (actinic keratosis) (20) Left Forearm - Posterior (3); Right Forearm - Posterior (4); Right Eyebrow; Left Supraorbital Region; Mid Parietal Scalp (8); Right Forehead; Left Temporal Scalp (2)  Destruction of lesion - Left Forearm - Posterior, Left Supraorbital Region, Left Temporal Scalp, Mid Parietal Scalp, Right Eyebrow, Right Forearm - Posterior, Right Forehead Complexity: simple   Destruction method: cryotherapy   Informed consent: discussed and consent obtained   Timeout:  patient name, date of birth, surgical site, and procedure verified Lesion destroyed using liquid nitrogen: Yes   Outcome: patient tolerated procedure well with no complications    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  History of atypical nevus Left Upper Back  History of basal cell carcinoma (BCC) (2) Right Zygomatic Area; Left Forehead  Neoplasm of uncertain behavior of skin (2) Left Shoulder - Anterior  Skin / nail biopsy Type of biopsy: tangential   Informed consent: discussed and consent obtained     Timeout: patient name, date of birth, surgical site, and procedure verified   Procedure prep:  Patient was prepped and draped in usual sterile fashion (Non sterile) Prep type:  Chlorhexidine Anesthesia: the lesion was anesthetized in a standard fashion   Anesthetic:  1% lidocaine w/ epinephrine 1-100,000 local infiltration Instrument used: flexible razor blade   Outcome: patient tolerated procedure well   Post-procedure details: wound care instructions given    Specimen 1 - Surgical pathology Differential Diagnosis: scc vs bcc Check Margins: No  Left Upper Arm - Anterior  Skin / nail biopsy Type of biopsy: tangential   Informed consent: discussed and consent obtained   Timeout: patient name, date of birth, surgical site, and procedure verified   Procedure prep:  Patient was prepped and draped in usual sterile fashion (Non sterile) Prep type:  Chlorhexidine Anesthesia: the lesion was anesthetized in a standard fashion   Anesthetic:  1% lidocaine w/ epinephrine 1-100,000 local infiltration Instrument used: flexible razor blade   Outcome: patient tolerated procedure well   Post-procedure details: wound care instructions given    Specimen 2 - Surgical pathology Differential Diagnosis: scc vs bcc Check Margins: No  Waist up skin exam.

## 2019-06-27 ENCOUNTER — Telehealth: Payer: Self-pay

## 2019-06-27 NOTE — Telephone Encounter (Signed)
-----   Message from Arlyss Gandy, PA-C sent at 06/20/2019  8:47 AM EDT ----- 30 min with me for supBCC

## 2019-06-27 NOTE — Telephone Encounter (Signed)
Phone call to patient with his pathology results. Voicemail left for patient to give the office a call back.  ?

## 2019-06-28 NOTE — Telephone Encounter (Signed)
Patient left message on office voice mail that he is returning phone call from yesterday.  He is calling on his cell phone [phone], but can also be reached on house phone [phone].

## 2019-06-30 NOTE — Telephone Encounter (Signed)
-----   Message from Arlyss Gandy, PA-C sent at 06/20/2019  8:47 AM EDT ----- 30 min with me for supBCC

## 2019-06-30 NOTE — Telephone Encounter (Signed)
Phone call to patient with his pathology results. Patient aware of results.  

## 2019-07-22 DIAGNOSIS — N4232 Atypical small acinar proliferation of prostate: Secondary | ICD-10-CM | POA: Diagnosis not present

## 2019-07-22 DIAGNOSIS — R972 Elevated prostate specific antigen [PSA]: Secondary | ICD-10-CM | POA: Diagnosis not present

## 2019-07-22 DIAGNOSIS — C61 Malignant neoplasm of prostate: Secondary | ICD-10-CM | POA: Diagnosis not present

## 2019-07-29 DIAGNOSIS — C61 Malignant neoplasm of prostate: Secondary | ICD-10-CM | POA: Diagnosis not present

## 2019-08-04 ENCOUNTER — Other Ambulatory Visit: Payer: Self-pay

## 2019-08-04 ENCOUNTER — Encounter: Payer: Self-pay | Admitting: Physician Assistant

## 2019-08-04 ENCOUNTER — Ambulatory Visit (INDEPENDENT_AMBULATORY_CARE_PROVIDER_SITE_OTHER): Payer: PPO | Admitting: Physician Assistant

## 2019-08-04 DIAGNOSIS — C44619 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, including shoulder: Secondary | ICD-10-CM

## 2019-08-04 NOTE — Progress Notes (Signed)
   Follow up Visit  Subjective  Deavin Forst is a 79 y.o. male who presents for the following: Procedure ([place]a left upper arm).  Objective  Well appearing patient in no apparent distress; mood and affect are within normal limits.  A focused examination was performed including left upper arm. Relevant physical exam findings are noted in the Assessment and Plan.   Objective  Left Upper Arm - Anterior: Biopsy scar identified.   Assessment & Plan  Basal cell carcinoma (BCC) of skin of left upper extremity including shoulder Left Upper Arm - Anterior  Destruction of lesion Complexity: simple   Destruction method: electrodesiccation and curettage   Informed consent: discussed and consent obtained   Timeout:  patient name, date of birth, surgical site, and procedure verified Anesthesia: the lesion was anesthetized in a standard fashion   Anesthetic:  1% lidocaine w/ epinephrine 1-100,000 local infiltration Curettage performed in three different directions: Yes   Curettage cycles:  3 Lesion length (cm):  0 Lesion width (cm):  2.5 Margin per side (cm):  0 Final wound size (cm):  2.5 Hemostasis achieved with:  ferric subsulfate Outcome: patient tolerated procedure well with no complications   Additional details:  Wound innoculated with 5 fluorouracil solution.

## 2019-08-04 NOTE — Patient Instructions (Signed)

## 2019-08-25 NOTE — Progress Notes (Signed)
GU Location of Tumor / Histology: prostatic adenocarcinoma  If Prostate Cancer, Gleason Score is (4 + 3) and PSA is (11.48). Prostate volume: 41.95 grams.  Chad Ford with a history of BPH. Continued rise in PSA prompted prostate biopsy.   Biopsies of prostate (if applicable) revealed:   Past/Anticipated interventions by urology, if any: prostate biopsy, referral to discuss radiotherapy options.   Past/Anticipated interventions by medical oncology, if any: no  Weight changes, if any: no  Bowel/Bladder complaints, if any:    Nausea/Vomiting, if any: no  Pain issues, if any:   SAFETY ISSUES:  Prior radiation?   Pacemaker/ICD?   Possible current pregnancy? no, male patient  Is the patient on methotrexate?   Current Complaints / other details:  79 year old male. Married with 2 daughters and 1 son. Retired.   Brachytherapy vs. Watchful waiting.

## 2019-08-26 ENCOUNTER — Ambulatory Visit
Admission: RE | Admit: 2019-08-26 | Discharge: 2019-08-26 | Disposition: A | Discharge status: Home / Self Care | Payer: Medicare Other | Source: Ambulatory Visit | Attending: Radiation Oncology | Admitting: Radiation Oncology

## 2019-08-26 ENCOUNTER — Encounter: Payer: Self-pay | Admitting: Radiation Oncology

## 2019-08-26 DIAGNOSIS — R972 Elevated prostate specific antigen [PSA]: Secondary | ICD-10-CM | POA: Diagnosis not present

## 2019-08-26 DIAGNOSIS — C61 Malignant neoplasm of prostate: Secondary | ICD-10-CM

## 2019-08-26 NOTE — Progress Notes (Signed)
Radiation Oncology         [phone] ________________________________  Initial Outpatient Consultation - Conducted via Telephone due to current COVID-19 concerns for limiting patient exposure  Name: Chad Ford MRN: [id number]  Date: 08/26/2019  DOB: Feb 10, 1940  JJ:HERDEY, Baldemar Friday., PA-C  Lucas Mallow, MD   REFERRING PHYSICIAN: Lucas Mallow, MD  DIAGNOSIS: 79 y.o. gentleman with Stage T1c adenocarcinoma of the prostate with Gleason score of 4+3, and PSA of 11.48.    ICD-10-CM   1. Prostate cancer ([place])  C61   2. Malignant neoplasm of prostate ([place])  C61     HISTORY OF PRESENT ILLNESS: Chad Ford is a 79 y.o. male with a diagnosis of prostate cancer. He has a longstanding history of BPH with elevated PSA at 9.12 in 2018, by his primary care provider, Bing Matter, PA-C.  Accordingly, he was initially referred for evaluation in urology by Dr. Gloriann Loan in 08/2016 and DRE was without concerning findings.  The patient declined prostate biopsy previously, therefore, they opted for close monitoring of the PSA as below-- 12/2016: 9.54 08/2017: 10.4 05/2019: 11.48 (performed at [facility] with his PCP)  He followed up with Dr. Gloriann Loan in urology on 03/30/2019 and DRE remained normal.  The PSA was further elevated at 11.48 in May 2021, therefore, the patient proceeded to transrectal ultrasound with 12 biopsies of the prostate on 07/22/2019.  The prostate volume measured 41.95 cc.  Out of 12 core biopsies, 2 were positive.  The maximum Gleason score was 4+3, and this was seen in the right apex (with PNI) and right apex lateral. Of note, four samples were noted to have only 1-2 glands but were deemed atypical and appeared suspicious for adenocarcinoma.  The patient reviewed the biopsy results with his urologist and he has kindly been referred today for discussion of potential radiation treatment options.   PREVIOUS RADIATION THERAPY: No  PAST MEDICAL HISTORY:  Past Medical History:   Diagnosis Date  . Anxiety   . Atypical mole 02/06/2017   Left Upper Back (moderate) (recheck 82months)  . BPH (benign prostatic hyperplasia)   . Cancer ([place])    Skin cancer- face and arm - basal  . GERD (gastroesophageal reflux disease)   . HOH (hard of hearing)   . Hyperlipidemia   . Hypertension   . 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 Psoriasis   . 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 Stroke [facility])    Mini strokes- no symptoms  . Superficial basal cell carcinoma (BCC) 06/16/2019   Left Upper Arm - Anterior  . Vertigo    treats with specific exercises      PAST SURGICAL HISTORY: Past Surgical History:  Procedure Laterality Date  . CIRCUMCISION    . COLONOSCOPY    . COLONOSCOPY W/ POLYPECTOMY    . HEMORRHOID SURGERY    . HERNIA REPAIR Right    Inguinal  . INGUINAL HERNIA REPAIR Left 12/31/2015   Procedure: HERNIA REPAIR INGUINAL ADULT;  Surgeon: Georganna Skeans, MD;  Location: [place];  Service: General;  Laterality: Left;  . INSERTION OF MESH Left 12/31/2015   Procedure: INSERTION OF MESH;  Surgeon: Georganna Skeans, MD;  Location: [place];  Service: General;  Laterality: Left;  . TONSILLECTOMY      FAMILY HISTORY: History  reviewed. No pertinent family history.  SOCIAL HISTORY:  Social History   Socioeconomic History  . Marital status: Married    Spouse name: Not on file  . Number of children: Not on file  . Years of education: Not on file  . Highest education level: Not on file  Occupational History  . Not on file  Tobacco Use  . Smoking status: Never Smoker  . Smokeless tobacco: Never Used  Substance and Sexual Activity  . Alcohol use: No  . Drug use: No  . Sexual activity: Not Currently  Other Topics Concern  . Not on file  Social History Narrative  . Not on file    Social Determinants of Health   Financial Resource Strain:   . Difficulty of Paying Living Expenses:   Food Insecurity:   . Worried About Charity fundraiser in the Last Year:   . Arboriculturist in the Last Year:   Transportation Needs:   . Film/video editor (Medical):   Marland Kitchen Lack of Transportation (Non-Medical):   Physical Activity:   . Days of Exercise per Week:   . Minutes of Exercise per Session:   Stress:   . Feeling of Stress :   Social Connections:   . Frequency of Communication with Friends and Family:   . Frequency of Social Gatherings with Friends and Family:   . Attends Religious Services:   . Active Member of Clubs or Organizations:   . Attends Archivist Meetings:   Marland Kitchen Marital Status:   Intimate Partner Violence:   . Fear of Current or Ex-Partner:   . Emotionally Abused:   Marland Kitchen Physically Abused:   . Sexually Abused:     ALLERGIES: Penicillins  MEDICATIONS:  Current Outpatient Medications  Medication Sig Dispense Refill  . amLODipine (NORVASC) 5 MG tablet Take 2.5 mg by mouth.     . benazepril (LOTENSIN) 40 MG tablet Take 40 mg by mouth at bedtime.    . calcium carbonate (TUMS - DOSED IN MG ELEMENTAL CALCIUM) 500 MG chewable tablet Chew 1 tablet by mouth as needed for indigestion or heartburn.    . ciclopirox (LOPROX) 0.77 % cream Apply 1 application topically at bedtime. Apply to feet nightly.    . diltiazem (DILACOR XR) 180 MG 24 hr capsule Take 180 mg by mouth at bedtime.    Marland Kitchen doxazosin (CARDURA) 8 MG tablet Take 8 mg by mouth at bedtime.    Marland Kitchen econazole nitrate 1 % cream Apply topically.    . fluocinonide cream (LIDEX) 9.14 % Apply 1 application topically daily as needed (psoriasis).    . hydrochlorothiazide (HYDRODIURIL) 25 MG tablet Take 25 mg by mouth at bedtime.    Marland Kitchen levofloxacin (LEVAQUIN) 750 MG tablet Take 750 mg by mouth daily.    . naproxen sodium (ANAPROX) 220 MG tablet Take 220 mg by mouth 2 (two) times daily as needed.    .  pravastatin (PRAVACHOL) 40 MG tablet Take 40 mg by mouth at bedtime.    . pravastatin (PRAVACHOL) 40 MG tablet TAKE 1 TABLET BY MOUTH EVERY DAY    . tadalafil (CIALIS) 20 MG tablet 1 po 30 minutes before intercourse    . aspirin 81 MG EC tablet Take by mouth. (Patient not taking: Reported on 08/26/2019)     No current facility-administered medications for this encounter.    REVIEW OF SYSTEMS:  On review of systems, the patient reports that he is doing well overall. He denies any chest  pain, shortness of breath, cough, fevers, chills, night sweats, unintended weight changes. He denies any bowel disturbances, and denies abdominal pain, nausea or vomiting. He denies any new musculoskeletal or joint aches or pains. A complete review of systems is obtained and is otherwise negative.    PHYSICAL EXAM:  Wt Readings from Last 3 Encounters:  12/24/15 167 lb 11.2 oz (76.1 kg)   Temp Readings from Last 3 Encounters:  12/31/15 97.9 F (36.6 C)  12/24/15 97.6 F (36.4 C)   BP Readings from Last 3 Encounters:  12/31/15 93/60  12/24/15 (!) 108/49   Pulse Readings from Last 3 Encounters:  12/31/15 (!) 46  12/24/15 66    /10  Physical exam not performed in light of telephone consult visit format.   KPS = 90  100 - Normal; no complaints; no evidence of disease. 90   - Able to carry on normal activity; minor signs or symptoms of disease. 80   - Normal activity with effort; some signs or symptoms of disease. 52   - Cares for self; unable to carry on normal activity or to do active work. 60   - Requires occasional assistance, but is able to care for most of his personal needs. 50   - Requires considerable assistance and frequent medical care. 27   - Disabled; requires special care and assistance. 56   - Severely disabled; hospital admission is indicated although death not imminent. 68   - Very sick; hospital admission necessary; active supportive treatment necessary. 10   - Moribund; fatal  processes progressing rapidly. 0     - Dead  Karnofsky DA, Abelmann Rainier, Craver LS and Burchenal [facility] [phone]) The use of the nitrogen mustards in the palliative treatment of carcinoma: with particular reference to bronchogenic carcinoma Cancer 1 634-56  LABORATORY DATA:  Lab Results  Component Value Date   WBC 4.8 12/24/2015   HGB 13.9 12/24/2015   HCT 41.3 12/24/2015   MCV 92.8 12/24/2015   PLT 169 12/24/2015   Lab Results  Component Value Date   NA 140 12/24/2015   K 3.8 12/24/2015   CL 104 12/24/2015   CO2 29 12/24/2015   No results found for: ALT, AST, GGT, ALKPHOS, BILITOT   RADIOGRAPHY: No results found.    IMPRESSION/PLAN: This visit was conducted via Telephone to spare the patient unnecessary potential exposure in the healthcare setting during the current COVID-19 pandemic. 1. 79 y.o. gentleman with Stage T1c adenocarcinoma of the prostate with Gleason Score of 4+3, and PSA of 11.48. We discussed the patient's workup and outlined the nature of prostate cancer in this setting. The patient's T stage, Gleason's score, and PSA put him into the unfavorable intermediate risk group. Accordingly, he is eligible for a variety of potential treatment options including brachytherapy, 5.5 weeks of external radiation, or prostatectomy.  Given his advanced age, he is not considered to be an ideal candidate for surgery, nor is he interested.  We discussed the available radiation techniques, and focused on the details and logistics of delivery. We discussed and outlined the risks, benefits, short and long-term effects associated with radiotherapy. We discussed the role of SpaceOAR in reducing the rectal toxicity associated with radiotherapy. Given his advanced age, we also discussed that active surveillance could also be an option but that based on his excellent performance status, curative therapy is felt to be most appropriate.  He was encouraged to ask questions that were answered to his stated  satisfaction.  At the end  of the conversation, the patient is interested in moving forward with brachytherapy and use of SpaceOAR to reduce rectal toxicity from radiotherapy.  We will share our discussion with Dr. Gloriann Loan and move forward with scheduling his CT [facility] planning appointment in the near future.  The patient will be connected with Romie Jumper in our office, who will be working closely with him to coordinate OR scheduling and pre and post procedure appointments.  We will contact the pharmaceutical rep to ensure that [employer] is available at the time of procedure.  We enjoyed meeting him today and look forward to continuing to participate in his care.  Given current concerns for patient exposure during the COVID-19 pandemic, this encounter was conducted via telephone. The patient was notified in advance and was offered a MyChart meeting to allow for face to face communication but unfortunately reported that he did not have the appropriate resources/technology to support such a visit and instead preferred to proceed with telephone consult. The patient has given verbal consent for this type of encounter. The time spent during this encounter was 30 minutes. The attendants for this meeting include Tyler Pita MD, Ashlyn Bruning PA-C, [place], and patient, Nedim Oki. During the encounter, Tyler Pita MD, Ashlyn Bruning PA-C, and scribe, Wilburn Mylar were located at [place].  Patient, Kaiyon Hynes was located at home.    [name], PA-C    Tyler Pita, MD  [facility] Direct Dial: [phone]  Fax: [phone] [email or web address]  Skype  [email or web address]  This document serves as a record of services personally performed by Tyler Pita, MD and Freeman Caldron, PA-C. It was created on their behalf by Wilburn Mylar, a trained medical scribe. The creation of this record is  based on the scribe's personal observations and the provider's statements to them. This document has been checked and approved by the attending provider.

## 2019-08-30 ENCOUNTER — Other Ambulatory Visit: Payer: Self-pay | Admitting: Urology

## 2019-08-30 ENCOUNTER — Telehealth: Payer: Self-pay | Admitting: *Deleted

## 2019-08-30 NOTE — Telephone Encounter (Signed)
CALLED PATIENT TO ASK QUESTIONS, SPOKE WITH PATIENT 

## 2019-08-31 ENCOUNTER — Telehealth: Payer: Self-pay | Admitting: *Deleted

## 2019-08-31 NOTE — Telephone Encounter (Signed)
CALLED PATIENT TO INFORM OF PRE-SEED APPTS. FOR 10-06-19, AND HIS IMPLANT FOR 10-28-19, SPOKE WITH PATIENT AND HE IS AWARE OF THESE APPTS.

## 2019-09-06 DIAGNOSIS — C61 Malignant neoplasm of prostate: Secondary | ICD-10-CM | POA: Diagnosis not present

## 2019-09-12 ENCOUNTER — Encounter: Payer: Self-pay | Admitting: Medical Oncology

## 2019-10-05 ENCOUNTER — Telehealth: Payer: Self-pay | Admitting: *Deleted

## 2019-10-05 NOTE — Telephone Encounter (Signed)
CALLED PATIENT TO REMIND OF PRE-SEED APPTS. FOR 10-06-19, SPOKE WITH PATIENT'S WIFE -CAMILLA AND SHE IS AWARE OF THESE APPTS.

## 2019-10-06 ENCOUNTER — Ambulatory Visit (HOSPITAL_COMMUNITY)
Admission: RE | Admit: 2019-10-06 | Discharge: 2019-10-06 | Disposition: A | Discharge status: Home / Self Care | Payer: PPO | Source: Ambulatory Visit | Attending: Urology | Admitting: Urology

## 2019-10-06 ENCOUNTER — Encounter: Payer: Self-pay | Admitting: Medical Oncology

## 2019-10-06 ENCOUNTER — Ambulatory Visit
Admission: RE | Admit: 2019-10-06 | Discharge: 2019-10-06 | Disposition: A | Discharge status: Home / Self Care | Payer: PPO | Source: Ambulatory Visit | Attending: Urology | Admitting: Urology

## 2019-10-06 ENCOUNTER — Ambulatory Visit
Admission: RE | Admit: 2019-10-06 | Discharge: 2019-10-06 | Disposition: A | Discharge status: Home / Self Care | Payer: PPO | Source: Ambulatory Visit | Attending: Radiation Oncology | Admitting: Radiation Oncology

## 2019-10-06 ENCOUNTER — Other Ambulatory Visit: Payer: Self-pay

## 2019-10-06 ENCOUNTER — Encounter (HOSPITAL_COMMUNITY)
Admission: RE | Admit: 2019-10-06 | Discharge: 2019-10-06 | Disposition: A | Discharge status: Home / Self Care | Payer: PPO | Source: Ambulatory Visit | Attending: Urology | Admitting: Urology

## 2019-10-06 DIAGNOSIS — C61 Malignant neoplasm of prostate: Secondary | ICD-10-CM | POA: Insufficient documentation

## 2019-10-06 DIAGNOSIS — Z01818 Encounter for other preprocedural examination: Secondary | ICD-10-CM | POA: Diagnosis not present

## 2019-10-06 NOTE — Progress Notes (Signed)
  Radiation Oncology         [phone] ________________________________  Name: Chad Ford MRN: [id number]  Date: 10/06/2019  DOB: 1940/04/24  SIMULATION AND TREATMENT PLANNING NOTE PUBIC ARCH STUDY  EU:MPNTIR, Baldemar Friday., PA-C  Lucas Mallow, MD  DIAGNOSIS: 79 y.o. gentleman with Stage T1c adenocarcinoma of the prostate with Gleason score of 4+3, and PSA of 11.48.  Oncology History  Malignant neoplasm of prostate ([place])  07/22/2019 Cancer Staging   Staging form: Prostate, AJCC 8th Edition - Clinical stage from 07/22/2019: Stage IIC (cT1c, cN0, cM0, PSA: 11.5, Grade Group: 3) - Signed by Freeman Caldron, PA-C on 08/26/2019   08/26/2019 Initial Diagnosis   Malignant neoplasm of prostate ([place])       ICD-10-CM   1. Malignant neoplasm of prostate ([place])  C61     COMPLEX SIMULATION:  The patient presented today for evaluation for possible prostate seed implant. He was brought to the radiation planning suite and placed supine on the CT couch. A 3-dimensional image study set was obtained in upload to the planning computer. There, on each axial slice, I contoured the prostate gland. Then, using three-dimensional radiation planning tools I reconstructed the prostate in view of the structures from the transperineal needle pathway to assess for possible pubic arch interference. In doing so, I did not appreciate any pubic arch interference. Also, the patient's prostate volume was estimated based on the drawn structure. The volume was 42.8 cc.  Given the pubic arch appearance and prostate volume, patient remains a good candidate to proceed with prostate seed implant. Today, he freely provided informed written consent to proceed.    PLAN: The patient will undergo prostate seed implant.   ________________________________  Sheral Apley. Tammi Klippel, M.D.

## 2019-10-18 DIAGNOSIS — C61 Malignant neoplasm of prostate: Secondary | ICD-10-CM | POA: Diagnosis not present

## 2019-10-19 ENCOUNTER — Other Ambulatory Visit: Payer: Self-pay | Admitting: Urology

## 2019-10-21 ENCOUNTER — Telehealth: Payer: Self-pay | Admitting: *Deleted

## 2019-10-21 NOTE — Telephone Encounter (Signed)
CALLED PATIENT TO REMIND OF LAB AND COVID TESTING FOR 10-25-19, SPOKE WITH PATIENT'S WIFE- CAMILLA AND SHE IS AWARE OF THESE APPTS.

## 2019-10-25 ENCOUNTER — Other Ambulatory Visit: Payer: Self-pay

## 2019-10-25 ENCOUNTER — Telehealth: Payer: Self-pay | Admitting: *Deleted

## 2019-10-25 ENCOUNTER — Other Ambulatory Visit (HOSPITAL_COMMUNITY)
Admission: RE | Admit: 2019-10-25 | Discharge: 2019-10-25 | Disposition: A | Discharge status: Home / Self Care | Payer: PPO | Source: Ambulatory Visit | Attending: Urology | Admitting: Urology

## 2019-10-25 ENCOUNTER — Encounter (HOSPITAL_COMMUNITY)
Admission: RE | Admit: 2019-10-25 | Discharge: 2019-10-25 | Disposition: A | Discharge status: Home / Self Care | Payer: PPO | Source: Ambulatory Visit | Attending: Urology | Admitting: Urology

## 2019-10-25 ENCOUNTER — Encounter (HOSPITAL_BASED_OUTPATIENT_CLINIC_OR_DEPARTMENT_OTHER): Payer: Self-pay | Admitting: Urology

## 2019-10-25 DIAGNOSIS — Z20822 Contact with and (suspected) exposure to covid-19: Secondary | ICD-10-CM | POA: Diagnosis not present

## 2019-10-25 DIAGNOSIS — Z01812 Encounter for preprocedural laboratory examination: Secondary | ICD-10-CM | POA: Insufficient documentation

## 2019-10-25 LAB — COMPREHENSIVE METABOLIC PANEL
ALT: 25 U/L (ref 0–44)
AST: 26 U/L (ref 15–41)
Albumin: 3.9 g/dL (ref 3.5–5.0)
Alkaline Phosphatase: 67 U/L (ref 38–126)
Anion gap: 8 (ref 5–15)
BUN: 18 mg/dL (ref 8–23)
CO2: 26 mmol/L (ref 22–32)
Calcium: 9 mg/dL (ref 8.9–10.3)
Chloride: 105 mmol/L (ref 98–111)
Creatinine, Ser: 1 mg/dL (ref 0.61–1.24)
GFR, Estimated: >60 mL/min (ref >60)
Glucose, Bld: 98 mg/dL (ref 70–99)
Potassium: 4 mmol/L (ref 3.5–5.1)
Sodium: 139 mmol/L (ref 135–145)
Total Bilirubin: 1 mg/dL (ref 0.3–1.2)
Total Protein: 6.4 g/dL — ABNORMAL LOW (ref 6.5–8.1)

## 2019-10-25 LAB — CBC
HCT: 42.8 % (ref 39.0–52.0)
Hemoglobin: 14.4 g/dL (ref 13.0–17.0)
MCH: 32.1 pg (ref 26.0–34.0)
MCHC: 33.6 g/dL (ref 30.0–36.0)
MCV: 95.3 fL (ref 80.0–100.0)
Platelets: 161 10*3/uL (ref 150–400)
RBC: 4.49 MIL/uL (ref 4.22–5.81)
RDW: 12.4 % (ref 11.5–15.5)
WBC: 4.1 10*3/uL (ref 4.0–10.5)
nRBC: 0 % (ref 0.0–0.2)

## 2019-10-25 LAB — APTT: aPTT: 31 seconds (ref 24–36)

## 2019-10-25 LAB — SARS CORONAVIRUS 2 (TAT 6-24 HRS): SARS Coronavirus 2: NEGATIVE

## 2019-10-25 LAB — PROTIME-INR
INR: 1 (ref 0.8–1.2)
Prothrombin Time: 12.5 seconds (ref 11.4–15.2)

## 2019-10-25 NOTE — Progress Notes (Signed)
Spoke w/ via phone for pre-op interview---Chad Folz Hardin Negus RN   Lab needs dos---- none               Lab results------labs done 10/25/19, ekg and cxr- 10/06/19  COVID test ------10/25/19 at 1130am Arrive at -------1100am NPO after MN NO Solid Food.  Clear liquids from MN until---1000am Medications to take morning of surgery ----none , pt to take benazepril, amlodipine, doxazosin and pravastatin at 1100pm the nite before surgery since he usually takes all meds at midnite. - Diabetic medication -----none  Patient Special Instructions -----none  Pre-Op special Istructions -----fleets enema am of surgery- pt aware  Patient verbalized understanding of instructions that were given at this phone interview. Patient denies shortness of breath, chest pain, fever, cough at this phone interview.

## 2019-10-25 NOTE — Telephone Encounter (Signed)
Returned patient's phone call, no answer 

## 2019-10-27 ENCOUNTER — Telehealth: Payer: Self-pay | Admitting: *Deleted

## 2019-10-27 NOTE — Telephone Encounter (Signed)
Called patient to remind of procedure for 10-28-19, spoke with patient and he is aware of this procedure

## 2019-10-28 ENCOUNTER — Encounter (HOSPITAL_BASED_OUTPATIENT_CLINIC_OR_DEPARTMENT_OTHER)
Admission: RE | Disposition: A | Discharge status: Home / Self Care | Payer: Self-pay | Source: Home / Self Care | Attending: Urology

## 2019-10-28 ENCOUNTER — Ambulatory Visit (HOSPITAL_COMMUNITY): Payer: PPO

## 2019-10-28 ENCOUNTER — Ambulatory Visit (HOSPITAL_BASED_OUTPATIENT_CLINIC_OR_DEPARTMENT_OTHER)
Admission: RE | Admit: 2019-10-28 | Discharge: 2019-10-28 | Disposition: A | Discharge status: Home / Self Care | Payer: PPO | Attending: Urology | Admitting: Urology

## 2019-10-28 ENCOUNTER — Ambulatory Visit (HOSPITAL_BASED_OUTPATIENT_CLINIC_OR_DEPARTMENT_OTHER): Payer: PPO | Admitting: Anesthesiology

## 2019-10-28 ENCOUNTER — Other Ambulatory Visit: Payer: Self-pay

## 2019-10-28 ENCOUNTER — Encounter (HOSPITAL_BASED_OUTPATIENT_CLINIC_OR_DEPARTMENT_OTHER): Payer: Self-pay | Admitting: Urology

## 2019-10-28 DIAGNOSIS — C61 Malignant neoplasm of prostate: Secondary | ICD-10-CM | POA: Diagnosis not present

## 2019-10-28 DIAGNOSIS — Z880 Allergy status to penicillin: Secondary | ICD-10-CM | POA: Insufficient documentation

## 2019-10-28 DIAGNOSIS — L409 Psoriasis, unspecified: Secondary | ICD-10-CM | POA: Diagnosis not present

## 2019-10-28 DIAGNOSIS — F419 Anxiety disorder, unspecified: Secondary | ICD-10-CM | POA: Diagnosis not present

## 2019-10-28 DIAGNOSIS — R351 Nocturia: Secondary | ICD-10-CM | POA: Insufficient documentation

## 2019-10-28 DIAGNOSIS — Z79899 Other long term (current) drug therapy: Secondary | ICD-10-CM | POA: Diagnosis not present

## 2019-10-28 DIAGNOSIS — E7800 Pure hypercholesterolemia, unspecified: Secondary | ICD-10-CM | POA: Insufficient documentation

## 2019-10-28 DIAGNOSIS — Z8673 Personal history of transient ischemic attack (TIA), and cerebral infarction without residual deficits: Secondary | ICD-10-CM | POA: Diagnosis not present

## 2019-10-28 DIAGNOSIS — I10 Essential (primary) hypertension: Secondary | ICD-10-CM | POA: Diagnosis not present

## 2019-10-28 DIAGNOSIS — Z86010 Personal history of colonic polyps: Secondary | ICD-10-CM | POA: Insufficient documentation

## 2019-10-28 DIAGNOSIS — K219 Gastro-esophageal reflux disease without esophagitis: Secondary | ICD-10-CM | POA: Diagnosis not present

## 2019-10-28 DIAGNOSIS — R3912 Poor urinary stream: Secondary | ICD-10-CM | POA: Diagnosis not present

## 2019-10-28 DIAGNOSIS — Z85828 Personal history of other malignant neoplasm of skin: Secondary | ICD-10-CM | POA: Insufficient documentation

## 2019-10-28 DIAGNOSIS — R42 Dizziness and giddiness: Secondary | ICD-10-CM | POA: Diagnosis not present

## 2019-10-28 DIAGNOSIS — N401 Enlarged prostate with lower urinary tract symptoms: Secondary | ICD-10-CM | POA: Insufficient documentation

## 2019-10-28 DIAGNOSIS — M109 Gout, unspecified: Secondary | ICD-10-CM | POA: Insufficient documentation

## 2019-10-28 DIAGNOSIS — M1990 Unspecified osteoarthritis, unspecified site: Secondary | ICD-10-CM | POA: Diagnosis not present

## 2019-10-28 DIAGNOSIS — E785 Hyperlipidemia, unspecified: Secondary | ICD-10-CM | POA: Diagnosis not present

## 2019-10-28 HISTORY — DX: Unspecified osteoarthritis, unspecified site: M19.90

## 2019-10-28 HISTORY — PX: RADIOACTIVE SEED IMPLANT: SHX5150

## 2019-10-28 HISTORY — PX: SPACE OAR INSTILLATION: SHX6769

## 2019-10-28 HISTORY — DX: Malignant neoplasm of prostate: C61

## 2019-10-28 SURGERY — INSERTION, RADIATION SOURCE, PROSTATE
Anesthesia: General

## 2019-10-28 MED ORDER — OXYCODONE HCL 5 MG/5ML PO SOLN: 5.0000 mg | Freq: Once | ORAL | Status: DC | PRN

## 2019-10-28 MED ORDER — DOCUSATE SODIUM 100 MG PO CAPS: 100.0000 mg | ORAL_CAPSULE | Freq: Every day | ORAL | 0 refills | Status: AC | PRN

## 2019-10-28 MED ORDER — STERILE WATER FOR IRRIGATION IR SOLN
Status: DC | PRN
  Administered 2019-10-28: 500 mL

## 2019-10-28 MED ORDER — OXYCODONE-ACETAMINOPHEN 5-325 MG PO TABS: 1.0000 | ORAL_TABLET | ORAL | 0 refills | Status: DC | PRN

## 2019-10-28 MED ORDER — CIPROFLOXACIN IN D5W 400 MG/200ML IV SOLN
INTRAVENOUS | Status: AC
  Filled 2019-10-28: qty 200

## 2019-10-28 MED ORDER — LACTATED RINGERS IV SOLN: INTRAVENOUS | Status: DC

## 2019-10-28 MED ORDER — IOHEXOL 300 MG/ML  SOLN
INTRAMUSCULAR | Status: DC | PRN
  Administered 2019-10-28: 7 mL

## 2019-10-28 MED ORDER — ONDANSETRON HCL 4 MG/2ML IJ SOLN: 4.0000 mg | Freq: Once | INTRAMUSCULAR | Status: DC | PRN

## 2019-10-28 MED ORDER — FLEET ENEMA 7-19 GM/118ML RE ENEM: 1.0000 | ENEMA | Freq: Once | RECTAL | Status: DC

## 2019-10-28 MED ORDER — AMISULPRIDE (ANTIEMETIC) 5 MG/2ML IV SOLN: 10.0000 mg | Freq: Once | INTRAVENOUS | Status: DC | PRN

## 2019-10-28 MED ORDER — LIDOCAINE 2% (20 MG/ML) 5 ML SYRINGE
INTRAMUSCULAR | Status: DC | PRN
  Administered 2019-10-28: 50 mg via INTRAVENOUS

## 2019-10-28 MED ORDER — FENTANYL CITRATE (PF) 100 MCG/2ML IJ SOLN
INTRAMUSCULAR | Status: DC | PRN
  Administered 2019-10-28 (×2): 25 ug via INTRAVENOUS
  Administered 2019-10-28: 50 ug via INTRAVENOUS

## 2019-10-28 MED ORDER — SODIUM CHLORIDE 0.9 % IV SOLN
INTRAVENOUS | Status: AC | PRN
  Administered 2019-10-28: 1000 mL via INTRAMUSCULAR

## 2019-10-28 MED ORDER — OXYCODONE HCL 5 MG PO TABS: 5.0000 mg | ORAL_TABLET | Freq: Once | ORAL | Status: DC | PRN

## 2019-10-28 MED ORDER — CIPROFLOXACIN IN D5W 400 MG/200ML IV SOLN
400.0000 mg | INTRAVENOUS | Status: AC
  Administered 2019-10-28: 400 mg via INTRAVENOUS

## 2019-10-28 MED ORDER — FENTANYL CITRATE (PF) 100 MCG/2ML IJ SOLN: 25.0000 ug | INTRAMUSCULAR | Status: DC | PRN

## 2019-10-28 MED ORDER — ONDANSETRON HCL 4 MG/2ML IJ SOLN
INTRAMUSCULAR | Status: DC | PRN
  Administered 2019-10-28: 4 mg via INTRAVENOUS

## 2019-10-28 MED ORDER — SODIUM CHLORIDE (PF) 0.9 % IJ SOLN
INTRAMUSCULAR | Status: DC | PRN
  Administered 2019-10-28: 10 mL via INTRAVENOUS

## 2019-10-28 MED ORDER — GLYCOPYRROLATE PF 0.2 MG/ML IJ SOSY
PREFILLED_SYRINGE | INTRAMUSCULAR | Status: DC | PRN
  Administered 2019-10-28: .2 mg via INTRAVENOUS

## 2019-10-28 MED ORDER — DEXAMETHASONE SODIUM PHOSPHATE 10 MG/ML IJ SOLN
INTRAMUSCULAR | Status: DC | PRN
  Administered 2019-10-28: 5 mg via INTRAVENOUS

## 2019-10-28 MED ORDER — PROPOFOL 10 MG/ML IV BOLUS
INTRAVENOUS | Status: DC | PRN
  Administered 2019-10-28: 160 mg via INTRAVENOUS

## 2019-10-28 SURGICAL SUPPLY — 38 items
AGX100 ×3 IMPLANT
BAG DRN RND TRDRP ANRFLXCHMBR (UROLOGICAL SUPPLIES) ×1
BAG URINE DRAIN 2000ML AR STRL (UROLOGICAL SUPPLIES) ×3 IMPLANT
BLADE CLIPPER SENSICLIP SURGIC (BLADE) ×3 IMPLANT
CATH FOLEY 2WAY SLVR  5CC 16FR (CATHETERS) ×2
CATH FOLEY 2WAY SLVR 5CC 16FR (CATHETERS) ×1 IMPLANT
CATH ROBINSON RED A/P 16FR (CATHETERS) IMPLANT
CATH ROBINSON RED A/P 20FR (CATHETERS) ×3 IMPLANT
CLOTH BEACON ORANGE TIMEOUT ST (SAFETY) ×3 IMPLANT
CNTNR URN SCR LID CUP LEK RST (MISCELLANEOUS) ×2 IMPLANT
CONT SPEC 4OZ STRL OR WHT (MISCELLANEOUS) ×6
COVER BACK TABLE 60X90IN (DRAPES) ×3 IMPLANT
COVER MAYO STAND STRL (DRAPES) ×3 IMPLANT
DRAPE C-ARM 35X43 STRL (DRAPES) ×3 IMPLANT
DRSG TEGADERM 4X4.75 (GAUZE/BANDAGES/DRESSINGS) ×3 IMPLANT
DRSG TEGADERM 8X12 (GAUZE/BANDAGES/DRESSINGS) ×3 IMPLANT
GAUZE SPONGE 4X4 12PLY STRL (GAUZE/BANDAGES/DRESSINGS) ×3 IMPLANT
GLOVE BIO SURGEON STRL SZ 6 (GLOVE) IMPLANT
GLOVE BIO SURGEON STRL SZ 6.5 (GLOVE) ×2 IMPLANT
GLOVE BIO SURGEON STRL SZ7.5 (GLOVE) ×3 IMPLANT
GLOVE BIO SURGEON STRL SZ8 (GLOVE) IMPLANT
GLOVE BIO SURGEONS STRL SZ 6.5 (GLOVE) ×1
GLOVE SURG ORTHO 8.5 STRL (GLOVE) IMPLANT
GLOVE SURG SS PI 6.5 STRL IVOR (GLOVE) IMPLANT
GOWN STRL REUS W/TWL LRG LVL3 (GOWN DISPOSABLE) ×3 IMPLANT
HOLDER FOLEY CATH W/STRAP (MISCELLANEOUS) IMPLANT
IMPL SPACEOAR VUE SYSTEM (Spacer) ×1 IMPLANT
IMPLANT SPACEOAR VUE SYSTEM (Spacer) ×3 IMPLANT
IV NS 1000ML (IV SOLUTION) ×3
IV NS 1000ML BAXH (IV SOLUTION) ×1 IMPLANT
KIT TURNOVER CYSTO (KITS) ×3 IMPLANT
MARKER SKIN DUAL TIP RULER LAB (MISCELLANEOUS) ×3 IMPLANT
PACK CYSTO (CUSTOM PROCEDURE TRAY) ×3 IMPLANT
SUT BONE WAX W31G (SUTURE) IMPLANT
SYR 10ML LL (SYRINGE) ×3 IMPLANT
TOWEL OR 17X26 10 PK STRL BLUE (TOWEL DISPOSABLE) ×3 IMPLANT
UNDERPAD 30X36 HEAVY ABSORB (UNDERPADS AND DIAPERS) ×6 IMPLANT
WATER STERILE IRR 500ML POUR (IV SOLUTION) ×3 IMPLANT

## 2019-10-28 NOTE — Discharge Instructions (Signed)
Activity:  You are encouraged to ambulate frequently (about every hour during waking hours) to help prevent blood clots from forming in your legs or lungs.  However, you should not engage in any heavy lifting (> 10-15 lbs), strenuous activity, or straining.   Diet: You should advance your diet as instructed by your physician.  It will be normal to have some bloating, nausea, and abdominal discomfort intermittently.   Prescriptions:  You will be provided a prescription for pain medication to take as needed.  If your pain is not severe enough to require the prescription pain medication, you may take extra strength Tylenol instead which will have less side effects.  You should also take a prescribed stool softener to avoid straining with bowel movements as the prescription pain medication may constipate you.   What to call us about: You should call the office [phone]) if you develop fever > 101 or develop persistent vomiting. Activity:  You are encouraged to ambulate frequently (about every hour during waking hours) to help prevent blood clots from forming in your legs or lungs.  However, you should not engage in any heavy lifting (> 10-15 lbs), strenuous activity, or straining.  Please remove foley catheter on Monday AM with provided 10cc syringe.   Post Anesthesia Home Care Instructions  Activity: Get plenty of rest for the remainder of the day. A responsible individual must stay with you for 24 hours following the procedure.  For the next 24 hours, DO NOT: -Drive a car -Paediatric nurse -Drink alcoholic beverages -Take any medication unless instructed by your physician -Make any legal decisions or sign important papers.  Meals: Start with liquid foods such as gelatin or soup. Progress to regular foods as tolerated. Avoid greasy, spicy, heavy foods. If nausea and/or vomiting occur, drink only clear liquids until the nausea and/or vomiting subsides. Call your physician if vomiting  continues.  Special Instructions/Symptoms: Your throat may feel dry or sore from the anesthesia or the breathing tube placed in your throat during surgery. If this causes discomfort, gargle with warm salt water. The discomfort should disappear within 24 hours.  If you had a scopolamine patch placed behind your ear for the management of post- operative nausea and/or vomiting:  1. The medication in the patch is effective for 72 hours, after which it should be removed.  Wrap patch in a tissue and discard in the trash. Wash hands thoroughly with soap and water. 2. You may remove the patch earlier than 72 hours if you experience unpleasant side effects which may include dry mouth, dizziness or visual disturbances. 3. Avoid touching the patch. Wash your hands with soap and water after contact with the patch.    Radioactive Seed Implant Home Care Instructions   Activity:    Rest for the remainder of the day.  Do not drive or operate equipment today.  You may resume normal  activities in a few days as instructed by your physician, without risk of harmful radiation exposure to those around you, provided you follow the time and distance precautions on the Radiation Oncology Instruction Sheet.   Meals: Drink plenty of lipuids and eat light foods, such as gelatin or soup this evening .  You may return to normal meal plan tomorrow.  Return To Work: You may return to work as instructed by Naval architect.  Special Instruction:   If any seeds are found, use tweezers to pick up seeds and place in a glass container of any kind and bring  to your physician's office.  Call your physician if any of these symptoms occur:   Persistent or heavy bleeding  Urine stream diminishes or stops completely after catheter is removed  Fever equal to or greater than 101 degrees F  Cloudy urine with a strong foul odor  Severe pain  You may feel some burning pain and/or hesitancy when you urinate after the catheter  is removed.  These symptoms may increase over the next few weeks, but should diminish within forur to six weeks.  Applying moist heat to the lower abdomen or a hot tub bath may help relieve the pain.  If the discomfort becomes severe, please call your physician for additional medications.

## 2019-10-28 NOTE — Anesthesia Postprocedure Evaluation (Signed)
Anesthesia Post Note  Patient: Chad Ford  Procedure(s) Performed: RADIOACTIVE SEED IMPLANT/BRACHYTHERAPY IMPLANT (N/A ) SPACE OAR INSTILLATION (N/A )     Patient location during evaluation: PACU Anesthesia Type: General Level of consciousness: awake and alert Pain management: pain level controlled Vital Signs Assessment: post-procedure vital signs reviewed and stable Respiratory status: spontaneous breathing, nonlabored ventilation, respiratory function stable and patient connected to nasal cannula oxygen Cardiovascular status: blood pressure returned to baseline and stable Postop Assessment: no apparent nausea or vomiting Anesthetic complications: no   No complications documented.  Last Vitals:  Vitals:   10/28/19 1128 10/28/19 1500  BP: 136/79 132/78  Pulse: (!) 55 61  Resp: 16 11  Temp: (!) 36.1 C (!) (P) 36.3 C  SpO2: 99% 96%    Last Pain:  Vitals:   10/28/19 1128  TempSrc: Oral                 Lidia Collum

## 2019-10-28 NOTE — Op Note (Signed)
Operative Note  Preoperative diagnosis:  1.  Adenocarcinoma of the prostate  Postoperative diagnosis: 1.  Adenocarcinoma of the prostate  Procedure(s): 1. I-125 radioactive seed implantation 2. Cystoscopy  3. Placement of SpaceOAR  Surgeon: Rexene Alberts, MD  Assistants:  None  Anesthesia:  General  Complications:  None  EBL:  minimal  Specimens: 1. * No specimens in log *  Drains/Catheters: 1.  16 Fr foley catheter  Intraoperative findings:   1. Successful brachytherapy. No seeds in bladder at end of case.  Indication:  Chad Ford is a 79 y.o. male with Gleason score 4+3 in 2/12 cores in right apex. After a thorough discussion including all risks benefits and alternatives, he presents today for this procedure.  Description of procedure: Description of procedure: After informed consent the patient was brought to the major OR, placed on the table and administered general anesthesia. He was then moved to the modified lithotomy position with his perineum perpendicular to the floor. His perineum and genitalia were then sterilely prepped. An official timeout was then performed. A 16 French Foley catheter was then placed in the bladder and filled with dilute contrast, a rectal tube was placed in the rectum and the transrectal ultrasound probe was placed in the rectum and affixed to the stand. He was then sterilely draped.  Real time ultrasonography was used along with the seed planning software. This was used to develop the seed plan including the number of needles as well as number of seeds required for complete and adequate coverage. Real-time ultrasonography was then used along with the previously developed plan and the Nucletron device to implant a total of 78 seeds using 23 needles. This proceeded without difficulty or complication.   I then proceeded with placement of SpaceOAR by introducing a needle with the bevel angled inferiorly approximately 2 cm superior to the anus.  This was angled downward and under direct ultrasound was placed within the space between the prostatic capsule and rectum. This was confirmed with a small amount of sterile saline injected and this was performed under direct ultrasound. I then attached the SpaceOAR to the needle and injected this in the space between the prostate and rectum with good placement noted.  A Foley catheter was then removed as well as the transrectal ultrasound probe and rectal probe. Flexible cystoscopy was then performed using the 16 French flexible scope which revealed a normal urethra throughout its length down to the sphincter which appeared intact. The prostatic urethra revealed bilobar hypertrophy but no evidence of obstruction, seeds, spacers or lesions. The bladder was then entered and fully and systematically inspected. The ureteral orifices were noted to be of normal configuration and position. The mucosa revealed no evidence of tumors. There were also no stones identified within the bladder. I noted no seeds or spacers on the floor of the bladder and retroflexion of the scope revealed no seeds protruding from the base of the prostate.  The cystoscope was then removed and the patient was awakened and taken to recovery room in stable and satisfactory condition. He tolerated procedure well and there were no intraoperative complications.  Plan:  Discharge home with foley in place. Remove foley catheter Monday AM with provided 10cc balloon.  [facility] R[facility]  Pager: [phone]

## 2019-10-28 NOTE — Anesthesia Procedure Notes (Signed)
Procedure Name: LMA Insertion Date/Time: 10/28/2019 1:25 PM Performed by: Suan Halter, CRNA Pre-anesthesia Checklist: Patient identified, Emergency Drugs available, Suction available and Patient being monitored Patient Re-evaluated:Patient Re-evaluated prior to induction Oxygen Delivery Method: Circle system utilized Preoxygenation: Pre-oxygenation with 100% oxygen Induction Type: IV induction Ventilation: Mask ventilation without difficulty LMA: LMA inserted LMA Size: 4.0 Number of attempts: 1 Airway Equipment and Method: Bite block Placement Confirmation: positive ETCO2 Tube secured with: Tape Dental Injury: Teeth and Oropharynx as per pre-operative assessment

## 2019-10-28 NOTE — Anesthesia Preprocedure Evaluation (Signed)
Anesthesia Evaluation  Patient identified by MRN, date of birth, ID band Patient awake    Reviewed: Allergy & Precautions, NPO status , Patient's Chart, lab work & pertinent test results  History of Anesthesia Complications Negative for: history of anesthetic complications  Airway Mallampati: II  TM Distance: >3 FB Neck ROM: Full    Dental  (+) Teeth Intact   Pulmonary neg pulmonary ROS,    Pulmonary exam normal        Cardiovascular hypertension, Normal cardiovascular exam     Neuro/Psych Anxiety TIA   GI/Hepatic Neg liver ROS, GERD  ,  Endo/Other  negative endocrine ROS  Renal/GU negative Renal ROS   Prostate cancer    Musculoskeletal  (+) Arthritis ,   Abdominal   Peds  Hematology negative hematology ROS (+)   Anesthesia Other Findings   Reproductive/Obstetrics                             Anesthesia Physical Anesthesia Plan  ASA: II  Anesthesia Plan: General   Post-op Pain Management:    Induction: Intravenous  PONV Risk Score and Plan: 2 and Ondansetron, Dexamethasone, Midazolam and Treatment may vary due to age or medical condition  Airway Management Planned: LMA  Additional Equipment: None  Intra-op Plan:   Post-operative Plan: Extubation in OR  Informed Consent: I have reviewed the patients History and Physical, chart, labs and discussed the procedure including the risks, benefits and alternatives for the proposed anesthesia with the patient or authorized representative who has indicated his/her understanding and acceptance.     Dental advisory given  Plan Discussed with:   Anesthesia Plan Comments:         Anesthesia Quick Evaluation

## 2019-10-28 NOTE — H&P (Signed)
Office Visit Report     10/18/2019   --------------------------------------------------------------------------------   Chad Ford  MRN: [id number]  DOB: 12/21/1940, 79 year old Male  SSN: [id number]   PRIMARY CARE:  Kristen Kaplan, PA  REFERRING:  Kristen Kaplan, PA  PROVIDER:  Eugene Bell, III, M.D.  TREATING:  Amanda Yarbrough Dancy, PA  LOCATION:  [facility] - [id number]     --------------------------------------------------------------------------------   CC/HPI: Pt presents today for pre-operative history and physical exam in anticipation of brachytherapy and space oar placement on 10/28/19. He is doing well and is without complaint.  Pt denies F/C, HA, CP, SOB, N/V, diarrhea/constipation, back pain, flank pain, hematuria, and dysuria.    HX:   Patient had a repeat PSA that was 11.48. After full informed discussion, he elected to proceed with prostate biopsy.   07/29/2019  Patient is doing well since prostate biopsy. Prostate size was 41 g. Unfortunately, biopsy revealed Gleason 4 + 3 adenocarcinoma the prostate in 2 of 12 cores. There were 4 cores of atypia. Prostate exam was benign. Last IPSS score was 13.   09/06/2019  In the interval, the patient met with Dr. Manning. He is elected undergo brachytherapy with spaceoar. he has no complaints today. He had a few questions about the procedure. All questions were answered.     ALLERGIES: penicillin - Hives    MEDICATIONS: Cialis 20 mg tablet  Amlodipine Besylate 2.5 mg tablet  Benazepril Hcl 40 mg tablet  Ciclopirox 0.77 % cream  Diltiazem 24Hr Er (Cd) 180 mg capsule, ext release 24 hr  Doxazosin Mesylate 8 mg tablet  Pravastatin Sodium     GU PSH: Prostate Needle Biopsy - 07/22/2019 Vasectomy, to - 1975     NON-GU PSH: Anesth, Repair Of Hernia - 2017 Surgical Pathology, Gross And Microscopic Examination For Prostate Needle - 07/22/2019     GU PMH: Prostate Cancer - 07/29/2019 BPH w/LUTS -  03/30/2019 Elevated PSA - 03/30/2019 Nocturia - 03/30/2019 Weak Urinary Stream - 03/30/2019 BPH w/o LUTS - 2019, - 2019, - 2018    NON-GU PMH: Anxiety Arthritis GERD Gout Hypercholesterolemia Hypertension    FAMILY HISTORY: 2 daughters - Daughter 1 son - Son   SOCIAL HISTORY: Marital Status: Married Preferred Language: English; Ethnicity: Not Hispanic Or Latino; Race: White Current Smoking Status: Patient has never smoked.   Tobacco Use Assessment Completed: Used Tobacco in last 30 days? Does not use smokeless tobacco. Does not drink anymore.  Does not use drugs. Drinks 3 caffeinated drinks per day. Has not had a blood transfusion. Patient's occupation is/was retired.    REVIEW OF SYSTEMS:    GU Review Male:   Patient reports get up at night to urinate. Patient denies frequent urination, hard to postpone urination, burning/ pain with urination, leakage of urine, stream starts and stops, trouble starting your stream, have to strain to urinate , erection problems, and penile pain.  Gastrointestinal (Upper):   Patient denies nausea, vomiting, and indigestion/ heartburn.  Gastrointestinal (Lower):   Patient denies constipation and diarrhea.  Constitutional:   Patient denies fever, night sweats, weight loss, and fatigue.  Skin:   Patient denies skin rash/ lesion and itching.  Eyes:   Patient denies blurred vision and double vision.  Ears/ Nose/ Throat:   Patient denies sore throat and sinus problems.  Hematologic/Lymphatic:   Patient denies swollen glands and easy bruising.  Cardiovascular:   Patient denies leg swelling and chest pains.  Respiratory:   Patient denies cough and shortness   of breath.  Endocrine:   Patient denies excessive thirst.  Musculoskeletal:   Patient denies back pain and joint pain.  Neurological:   Patient denies headaches and dizziness.  Psychologic:   Patient denies depression and anxiety.   VITAL SIGNS:      10/18/2019 01:12 PM  Weight 160 lb / 72.57  kg  Height 68 in / 172.72 cm  BP 146/80 mmHg  Pulse 64 /min  Temperature 97.3 F / 36.2 C  BMI 24.3 kg/m   MULTI-SYSTEM PHYSICAL EXAMINATION:    Constitutional: Well-nourished. No physical deformities. Normally developed. Good grooming.  Neck: Neck symmetrical, not swollen. Normal tracheal position.  Respiratory: Normal breath sounds. No labored breathing, no use of accessory muscles.   Cardiovascular: Regular rate and rhythm. No murmur, no gallop.   Lymphatic: No enlargement of neck, axillae, groin.  Skin: No paleness, no jaundice, no cyanosis. No lesion, no ulcer, no rash.  Neurologic / Psychiatric: Oriented to time, oriented to place, oriented to person. No depression, no anxiety, no agitation.  Gastrointestinal: No mass, no tenderness, no rigidity, non obese abdomen.  Eyes: Normal conjunctivae. Normal eyelids.  Ears, Nose, Mouth, and Throat: Left ear no scars, no lesions, no masses. Right ear no scars, no lesions, no masses. Nose no scars, no lesions, no masses. Normal hearing. Normal lips.  Musculoskeletal: Normal gait and station of head and neck.     Complexity of Data:  Records Review:   Previous Patient Records  Urine Test Review:   Urinalysis   10/18/19  Urinalysis  Urine Appearance Clear   Urine Color Yellow   Urine Glucose Neg mg/dL  Urine Bilirubin Neg mg/dL  Urine Ketones Neg mg/dL  Urine Specific Gravity 1.020   Urine Blood Neg ery/uL  Urine pH <=5.0   Urine Protein Neg mg/dL  Urine Urobilinogen 0.2 mg/dL  Urine Nitrites Neg   Urine Leukocyte Esterase Neg leu/uL   PROCEDURES:          Urinalysis - 81003 Dipstick Dipstick Cont'd  Color: Yellow Bilirubin: Neg mg/dL  Appearance: Clear Ketones: Neg mg/dL  Specific Gravity: 1.020 Blood: Neg ery/uL  pH: <=5.0 Protein: Neg mg/dL  Glucose: Neg mg/dL Urobilinogen: 0.2 mg/dL    Nitrites: Neg    Leukocyte Esterase: Neg leu/uL    ASSESSMENT:      ICD-10 Details  1 GU:   Prostate Cancer - C61    PLAN:            Schedule Return Visit/Planned Activity: Keep Scheduled Appointment - Schedule Surgery          Document Letter(s):  Created for Patient: Clinical Summary         Notes:   There are no changes in the patients history or physical exam since last evaluation by Dr. Gloriann Loan. Pt is scheduled to undergo brachytherapy and space oar placement on 10/28/19.   It was explained to the pt that Dr. Gloriann Loan is unexpectedly out and that his procedure may be delayed briefly but that an AUS physician will perform his surgery ASAP if not on his previously scheduled date.   All pt's questions were answered to the best of my ability.          Next Appointment:      Next Appointment: 10/18/2019 01:00 PM    Appointment Type: Pre OP Appontment    Location: [facility] [phone]    Provider: Mcarthur Rossetti, PA    Reason for Visit: PREOP BELL  Signed by Mcarthur Rossetti, PA on 10/18/19 at 1:31 PM (EDT)  Urology Preoperative H&P   Chief Complaint: Prostate cancer  History of Present Illness: Chord Takahashi is a 79 y.o. male with prostate cancer here for brachytherapy and Space OAR. Denies fevers or chills.    Past Medical History:  Diagnosis Date  . Anxiety   . Arthritis   . Atypical mole 02/06/2017   Left Upper Back (moderate) (recheck 47month)  . BPH (benign prostatic hyperplasia)   . [place]    Skin cancer- face and arm - basal  . GERD (gastroesophageal reflux disease)   . HOH (hard of hearing)   . Hyperlipidemia   . Hypertension   . 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 [place]state cancer ([place]   . Psoriasis   . 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 Stroke ([facility]    Mini strokes- no symptoms  . Superficial basal cell carcinoma (BCC) 06/16/2019   Left Upper  Arm - Anterior  . Vertigo    treats with specific exercises    Past Surgical History:  Procedure Laterality Date  . CIRCUMCISION    . COLONOSCOPY    . COLONOSCOPY W/ POLYPECTOMY    . HEMORRHOID SURGERY    . HERNIA REPAIR Right    Inguinal  . INGUINAL HERNIA REPAIR Left 12/31/2015   Procedure: HERNIA REPAIR INGUINAL ADULT;  Surgeon: BGeorganna Skeans MD;  [place] [facility]  Service: General;  Laterality: Left;  . INSERTION OF MESH Left 12/31/2015   Procedure: INSERTION OF MESH;  Surgeon: BGeorganna Skeans MD;  [place]  Service: General;  Laterality: Left;  . TONSILLECTOMY      Allergies:  Allergies  Allergen Reactions  . Penicillins Hives and Swelling    SWELLING REACTION UNSPECIFIED, FROM CHILDHOOD.  Has patient had a PCN reaction causing immediate rash, facial/tongue/throat swelling, SOB or lightheadedness with hypotension: Unknown Has patient had a PCN reaction causing severe rash involving mucus membranes or skin necrosis: No Has patient had a PCN reaction that required hospitalization: No Has patient had a PCN reaction occurring within the last 10 years: No If all of the above answers are "NO", then may proceed with Cephalosporin use.     History reviewed. No pertinent family history.  Social History:  reports that he has never smoked. He has never used smokeless tobacco. He reports that he does not drink alcohol and does not use drugs.  ROS: A complete review of systems was performed.  All systems are negative except for pertinent findings as noted.  Physical Exam:  Vital signs in last 24 hours: Temp:  [97 F (36.1 C)] 97 F (36.1 C) (10/15 1128) Pulse Rate:  [55] 55 (10/15 1128) Resp:  [16] 16 (10/15 1128) BP: (136)/(79) 136/79 (10/15 1128) SpO2:  [99 %] 99 % (10/15 1128) Weight:  [71.7 kg] 71.7 kg (10/15 1128) Constitutional:  Alert and oriented, No acute distress Cardiovascular: Regular rate and rhythm Respiratory: Normal respiratory effort, Lungs  clear bilaterally GI: Abdomen is soft, nontender, nondistended, no abdominal masses GU: No CVA tenderness Lymphatic: No lymphadenopathy Neurologic: Grossly intact, no focal deficits Psychiatric: Normal mood and affect  Laboratory Data:  No results for input(s): WBC, HGB, HCT, PLT in the last 72 hours.  No results for input(s): NA, K, CL, GLUCOSE,  BUN, CALCIUM, CREATININE in the last 72 hours.  Invalid input(s): CO3   No results found for this or any previous visit (from the past 24 hour(s)). Recent Results (from the past 240 hour(s))  SARS CORONAVIRUS 2 (TAT 6-24 HRS) Nasopharyngeal Nasopharyngeal Swab     Status: None   Collection Time: 10/25/19 10:48 AM   Specimen: Nasopharyngeal Swab  Result Value Ref Range Status   SARS Coronavirus 2 NEGATIVE NEGATIVE Final    Comment: (NOTE) SARS-CoV-2 target nucleic acids are NOT DETECTED.  The SARS-CoV-2 RNA is generally detectable in upper and lower respiratory specimens during the acute phase of infection. Negative results do not preclude SARS-CoV-2 infection, do not rule out co-infections with other pathogens, and should not be used as the sole basis for treatment or other patient management decisions. Negative results must be combined with clinical observations, patient history, and epidemiological information. The expected result is Negative.  Fact Sheet for Patients: https://www.fda.gov/media/138098/download  Fact Sheet for Healthcare Providers: https://www.fda.gov/media/138095/download  This test is not yet approved or cleared by the [place] FDA and  has been authorized for detection and/or diagnosis of SARS-CoV-2 by FDA under an Emergency Use Authorization (EUA). This EUA will remain  in effect (meaning this test can be used) for the duration of the COVID-19 declaration under Se ction 564(b)(1) of the Act, 21 U.S.C. section 360bbb-3(b)(1), unless the authorization is terminated or revoked sooner.  Performed at  [facility], [address] , [place] [address]     Renal Function: Recent Labs    10/25/19 1007  CREATININE 1.00   Estimated Creatinine Clearance: 56.1 mL/min (by C-G formula based on SCr of 1 mg/dL).  Radiologic Imaging: No results found.  I independently reviewed the above imaging studies.  Assessment and Plan Axzel Rhue is a 79 y.o. male with localized prostate cancer (GS 4+3 in 2/12 cores, TRUS 41ml, pre-biopsy PSA 11.4) here for brachytherapy and SpaceOAR. All risks and benefits discussed. He agrees to proceed.  Matt R. Gay MD 10/28/2019, 11:39 AM  [facility] Pager: [phone]  

## 2019-10-28 NOTE — Transfer of Care (Signed)
Immediate Anesthesia Transfer of Care Note  Patient: Kemond Amorin  Procedure(s) Performed: Procedure(s) (LRB): RADIOACTIVE SEED IMPLANT/BRACHYTHERAPY IMPLANT (N/A) SPACE OAR INSTILLATION (N/A)  Patient Location: PACU  Anesthesia Type: General  Level of Consciousness: awake, oriented, sedated and patient cooperative  Airway & Oxygen Therapy: Patient Spontanous Breathing and Patient connected to face mask oxygen  Post-op Assessment: Report given to PACU RN and Post -op Vital signs reviewed and stable  Post vital signs: Reviewed and stable  Complications: No apparent anesthesia complications  Last Vitals:  Vitals Value Taken Time  BP 132/78 10/28/19 1500  Temp    Pulse 60 10/28/19 1501  Resp 14 10/28/19 1501  SpO2 96 % 10/28/19 1501  Vitals shown include unvalidated device data.  Last Pain:  Vitals:   10/28/19 1128  TempSrc: Oral      Patients Stated Pain Goal: 7 (37/44/51 4604)  Complications: No complications documented.

## 2019-10-30 NOTE — Progress Notes (Signed)
  Radiation Oncology         [phone] ________________________________  Name: Levert Heslop MRN: [id number]  Date: 10/30/2019  DOB: [birth year]       Prostate Seed Implant  IP:[id number], Baldemar Friday., PA-C  No ref. provider found  DIAGNOSIS:  Oncology History  Malignant neoplasm of prostate ([place])  07/22/2019 Cancer Staging   Staging form: Prostate, AJCC 8th Edition - Clinical stage from 07/22/2019: Stage IIC (cT1c, cN0, cM0, PSA: 11.5, Grade Group: 3) - Signed by Freeman Caldron, PA-C on 08/26/2019   08/26/2019 Initial Diagnosis   Malignant neoplasm of prostate ([place])       ICD-10-CM   1. Malignant neoplasm of prostate ([place])  C61 Urinary leg bag    Discharge patient    PROCEDURE: Insertion of radioactive I-125 seeds into the prostate gland.  RADIATION DOSE: 145 Gy, definitive therapy.  TECHNIQUE: Jaquis Picklesimer was brought to the operating room with the urologist. He was placed in the dorsolithotomy position. He was catheterized and a rectal tube was inserted. The perineum was shaved, prepped and draped. The ultrasound probe was then introduced into the rectum to see the prostate gland.  TREATMENT DEVICE: A needle grid was attached to the ultrasound probe stand and anchor needles were placed.  3D PLANNING: The prostate was imaged in 3D using a sagittal sweep of the prostate probe. These images were transferred to the planning computer. There, the prostate, urethra and rectum were defined on each axial reconstructed image. Then, the software created an optimized 3D plan and a few seed positions were adjusted. The quality of the plan was reviewed using [facility] information for the target and the following two organs at risk:  Urethra and Rectum.  Then the accepted plan was printed and handed off to the radiation therapist.  Under my supervision, the custom loading of the seeds and spacers was carried out and loaded into sealed vicryl sleeves.  These pre-loaded needles were then placed into  the needle holder.Marland Kitchen  PROSTATE VOLUME STUDY:  Using transrectal ultrasound the volume of the prostate was verified to be 56.6 cc.  SPECIAL TREATMENT PROCEDURE/SUPERVISION AND HANDLING: The pre-loaded needles were then delivered under sagittal guidance. A total of 23 needles were used to deposit 78 seeds in the prostate gland. The individual seed activity was 0.484 mCi.  SpaceOAR:  Yes  COMPLEX SIMULATION: At the end of the procedure, an anterior radiograph of the pelvis was obtained to document seed positioning and count. Cystoscopy was performed to check the urethra and bladder.  MICRODOSIMETRY: At the end of the procedure, the patient was emitting 0.103 mR/hr at 1 meter. Accordingly, he was considered safe for hospital discharge.  PLAN: The patient will return to the radiation oncology clinic for post implant CT dosimetry in three weeks.   ________________________________  Sheral Apley Tammi Klippel, M.D.

## 2019-10-31 ENCOUNTER — Encounter (HOSPITAL_BASED_OUTPATIENT_CLINIC_OR_DEPARTMENT_OTHER): Payer: Self-pay | Admitting: Urology

## 2019-11-09 ENCOUNTER — Ambulatory Visit: Payer: PPO | Admitting: Physician Assistant

## 2019-11-15 ENCOUNTER — Telehealth: Payer: Self-pay | Admitting: *Deleted

## 2019-11-15 NOTE — Telephone Encounter (Signed)
Called patient to remind of post seed appts. for 11-16-19, spoke with patient and he is aware of these appts.

## 2019-11-16 ENCOUNTER — Ambulatory Visit
Admission: RE | Admit: 2019-11-16 | Discharge: 2019-11-16 | Disposition: A | Discharge status: Home / Self Care | Payer: PPO | Source: Ambulatory Visit | Attending: Urology | Admitting: Urology

## 2019-11-16 ENCOUNTER — Encounter: Payer: Self-pay | Admitting: Urology

## 2019-11-16 ENCOUNTER — Ambulatory Visit
Admission: RE | Admit: 2019-11-16 | Discharge: 2019-11-16 | Disposition: A | Discharge status: Home / Self Care | Payer: PPO | Source: Ambulatory Visit | Attending: Radiation Oncology | Admitting: Radiation Oncology

## 2019-11-16 ENCOUNTER — Other Ambulatory Visit: Payer: Self-pay

## 2019-11-16 VITALS — BP 122/69 | HR 88 | Temp 97.9°F | Resp 18 | Ht 68.5 in

## 2019-11-16 DIAGNOSIS — R350 Frequency of micturition: Secondary | ICD-10-CM | POA: Insufficient documentation

## 2019-11-16 DIAGNOSIS — C61 Malignant neoplasm of prostate: Secondary | ICD-10-CM | POA: Insufficient documentation

## 2019-11-16 DIAGNOSIS — R3911 Hesitancy of micturition: Secondary | ICD-10-CM | POA: Diagnosis not present

## 2019-11-16 DIAGNOSIS — Z79899 Other long term (current) drug therapy: Secondary | ICD-10-CM | POA: Diagnosis not present

## 2019-11-16 DIAGNOSIS — Z923 Personal history of irradiation: Secondary | ICD-10-CM | POA: Diagnosis not present

## 2019-11-16 NOTE — Progress Notes (Signed)
°  Radiation Oncology         [phone] ________________________________  Name: Chad Ford MRN: [id number]  Date: 11/16/2019  DOB: 1940-09-25  COMPLEX SIMULATION NOTE  NARRATIVE:  The patient was brought to the [facility] today following prostate seed implantation approximately one month ago.  Identity was confirmed.  All relevant records and images related to the planned course of therapy were reviewed.  Then, the patient was set-up supine.  CT images were obtained.  The CT images were loaded into the planning software.  Then the prostate and rectum were contoured.  Treatment planning then occurred.  The implanted iodine 125 seeds were identified by the physics staff for projection of radiation distribution  I have requested : 3D Simulation  I have requested a DVH of the following structures: Prostate and rectum.    ________________________________  Sheral Apley Tammi Klippel, M.D.

## 2019-11-16 NOTE — Progress Notes (Signed)
Radiation Oncology         [phone] ________________________________  Name: Chad Ford MRN: [id number]  Date: 11/16/2019  DOB: 06/01/1940  Post-Seed Follow-Up Visit Note  CC: Aletha Halim., PA-C  Lucas Mallow, MD  Diagnosis:   79 y.o. gentleman with Stage T1c adenocarcinoma of the prostate with Gleason score of 4+3, and PSA of 11.48.    ICD-10-CM   1. Malignant neoplasm of prostate ([place])  C61     Interval Since Last Radiation:  2.5 weeks 10/28/19:  Insertion of radioactive I-125 seeds into the prostate gland; 145 Gy, definitive therapy with placement of SpaceOAR gel.  Narrative:  The patient returns today for routine follow-up.  He is complaining of increased urinary frequency and urinary hesitation symptoms. He filled out a questionnaire regarding urinary function today providing and overall IPSS score of 29 characterizing his symptoms as severe with weak stream, hesitancy, intermittency, increased frequency, incomplete bladder emptying and nocturia x4, but admittedly gradually improving. He reports mild dysuria at initiation of his stream but denies persistent dysuria, gross hematuria, fever, chills or night sweats. He has had some loose stools with occasional diarrhea that is managed with Imodium as needed. He denies abdominal pain, nausea, vomiting or constipation. He reports a healthy appetite and is maintaining his weight. He has not noticed much if any change in his energy level. Overall, he is pleased with his progress to date.  ALLERGIES:  is allergic to penicillins.  Meds: Current Outpatient Medications  Medication Sig Dispense Refill  . amLODipine (NORVASC) 5 MG tablet Take 2.5 mg by mouth daily.     . benazepril (LOTENSIN) 40 MG tablet Take 40 mg by mouth at bedtime.    . calcium carbonate (TUMS - DOSED IN MG ELEMENTAL CALCIUM) 500 MG chewable tablet Chew 1 tablet by mouth as needed for indigestion or heartburn.    . ciclopirox (LOPROX) 0.77 % cream  Apply 1 application topically at bedtime. Apply to feet every other nite    . docusate sodium (COLACE) 100 MG capsule Take 1 capsule (100 mg total) by mouth daily as needed. 30 capsule 0  . doxazosin (CARDURA) 8 MG tablet Take 8 mg by mouth at bedtime.    Marland Kitchen oxyCODONE-acetaminophen (PERCOCET) 5-325 MG tablet Take 1 tablet by mouth every 4 (four) hours as needed for up to 6 doses for severe pain. 6 tablet 0  . pravastatin (PRAVACHOL) 40 MG tablet Take 40 mg by mouth at bedtime.    . tadalafil (CIALIS) 20 MG tablet 1 po 30 minutes before intercourse     No current facility-administered medications for this visit.    Physical Findings: In general this is a well appearing Caucasian male in no acute distress. He's alert and oriented x4 and appropriate throughout the examination. Cardiopulmonary assessment is negative for acute distress and he exhibits normal effort.   Lab Findings: Lab Results  Component Value Date   WBC 4.1 10/25/2019   HGB 14.4 10/25/2019   HCT 42.8 10/25/2019   MCV 95.3 10/25/2019   PLT 161 10/25/2019    Radiographic Findings:  Patient underwent CT imaging in our clinic for post implant dosimetry. The CT will be reviewed by Dr. Tammi Klippel to confirm there is an adequate distribution of radioactive seeds throughout the prostate gland and ensure that there are no seeds in or near the rectum. . We suspect the final radiation plan and dosimetry will show appropriate coverage of the prostate gland. He understands that we will call  and inform him of any unexpected findings on further review of his imaging and dosimetry.  Impression/Plan: 79 y.o. gentleman with Stage T1c adenocarcinoma of the prostate with Gleason score of 4+3, and PSA of 11.48. The patient is recovering from the effects of radiation. His urinary symptoms should gradually improve over the next 4-6 months. We talked about this today. He is encouraged by his improvement already and is otherwise pleased with his outcome.  We also talked about long-term follow-up for prostate cancer following seed implant. He understands that ongoing PSA determinations and digital rectal exams will help perform surveillance to rule out disease recurrence. He has a follow up appointment scheduled with Dr.Gay on Friday, November 18, 2019. He understands what to expect with his PSA measures. Patient was also educated today about some of the long-term effects from radiation including a small risk for rectal bleeding and possibly erectile dysfunction. We talked about some of the general management approaches to these potential complications. However, I did encourage the patient to contact our office or return at any point if he has questions or concerns related to his previous radiation and prostate cancer.    Chad Johns, PA-C

## 2019-11-16 NOTE — Progress Notes (Signed)
Chad Ford is here today for a  Post-seed follow-up appointment.  Patient denies any pain. Urgency sometime Leakage none Stream weak Nocturia x4 Dysuria none Hematuria none Emptying bladder almost always Bowels  Some diarrhea, states that he has some bowel incontience Next appointment with urologist Friday November 5  Vitals:   11/16/19 0941  BP: 122/69  Pulse: 88  Resp: 18  Temp: 97.9 F (36.6 C)  SpO2: 100%  Height: 5' 8.5" (1.74 m)

## 2019-11-17 ENCOUNTER — Encounter: Payer: Self-pay | Admitting: Medical Oncology

## 2019-11-18 DIAGNOSIS — C61 Malignant neoplasm of prostate: Secondary | ICD-10-CM | POA: Diagnosis not present

## 2019-11-30 ENCOUNTER — Encounter: Payer: Self-pay | Admitting: Radiation Oncology

## 2019-11-30 DIAGNOSIS — C61 Malignant neoplasm of prostate: Secondary | ICD-10-CM | POA: Diagnosis not present

## 2019-12-13 DIAGNOSIS — Z0000 Encounter for general adult medical examination without abnormal findings: Secondary | ICD-10-CM | POA: Diagnosis not present

## 2019-12-13 DIAGNOSIS — C61 Malignant neoplasm of prostate: Secondary | ICD-10-CM | POA: Diagnosis not present

## 2019-12-13 DIAGNOSIS — R739 Hyperglycemia, unspecified: Secondary | ICD-10-CM | POA: Diagnosis not present

## 2019-12-13 DIAGNOSIS — I10 Essential (primary) hypertension: Secondary | ICD-10-CM | POA: Diagnosis not present

## 2019-12-13 DIAGNOSIS — N400 Enlarged prostate without lower urinary tract symptoms: Secondary | ICD-10-CM | POA: Diagnosis not present

## 2019-12-13 DIAGNOSIS — E7800 Pure hypercholesterolemia, unspecified: Secondary | ICD-10-CM | POA: Diagnosis not present

## 2019-12-19 NOTE — Progress Notes (Signed)
  Radiation Oncology         [phone] ________________________________  Name: Chad Ford MRN: [id number]  Date: 11/30/2019  DOB: 1940-12-16  3D Planning Note   Prostate Brachytherapy Post-Implant Dosimetry  Diagnosis:  79 y.o. gentleman with Stage T1c adenocarcinoma of the prostate with Gleason score of 4+3, and PSA of 11.48.  Narrative: On a previous date, Chad Ford returned following prostate seed implantation for post implant planning. He underwent CT scan complex simulation to delineate the three-dimensional structures of the pelvis and demonstrate the radiation distribution.  Since that time, the seed localization, and complex isodose planning with dose volume histograms have now been completed.  Results:   Prostate Coverage - The dose of radiation delivered to the 90% or more of the prostate gland (D90) was 110.05% of the prescription dose. This exceeds our goal of greater than 90%. Rectal Sparing - The volume of rectal tissue receiving the prescription dose or higher was 0.02 cc. This falls under our thresholds tolerance of 1.0 cc.  Impression: The prostate seed implant appears to show adequate target coverage and appropriate rectal sparing.  Plan:  The patient will continue to follow with urology for ongoing PSA determinations. I would anticipate a high likelihood for local tumor control with minimal risk for rectal morbidity.  ________________________________  Sheral Apley Tammi Klippel, M.D.

## 2019-12-20 ENCOUNTER — Other Ambulatory Visit: Payer: Self-pay

## 2019-12-20 ENCOUNTER — Encounter: Payer: Self-pay | Admitting: Dermatology

## 2019-12-20 ENCOUNTER — Ambulatory Visit: Payer: PPO | Admitting: Dermatology

## 2019-12-20 DIAGNOSIS — Z85828 Personal history of other malignant neoplasm of skin: Secondary | ICD-10-CM

## 2019-12-20 DIAGNOSIS — L309 Dermatitis, unspecified: Secondary | ICD-10-CM

## 2019-12-20 MED ORDER — BETAMETHASONE DIPROPIONATE AUG 0.05 % EX CREA: TOPICAL_CREAM | Freq: Every day | CUTANEOUS | 1 refills | Status: DC

## 2020-01-01 ENCOUNTER — Encounter: Payer: Self-pay | Admitting: Dermatology

## 2020-01-01 NOTE — Progress Notes (Signed)
   Follow-Up Visit   Subjective  Chad Ford is a 79 y.o. male who presents for the following: Follow-up (3 month follow up left upper shoulder- healing good no concnerns & left ankle- dry skin).  General skin check plus looking rash on legs Location:  Duration:  Quality:  Associated Signs/Symptoms: Modifying Factors:  Severity:  Timing: Context:   Objective  Well appearing patient in no apparent distress; mood and affect are within normal limits. 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   All skin waist up examined.  Plus legs.   Assessment & Plan    History of basal cell cancer Head - Anterior (Face)  Yearly skin check  History of squamous cell carcinoma of skin Right Upper Arm - Anterior  Yearly skin check  Dermatitis (2) Left Ankle - Anterior; Right Ankle - Anterior  Augmented Diprolene with option of covering the cream for 20 to 30 minutes with moist wrap.  Follow-up by MyChart or phone in 1 month.  augmented betamethasone dipropionate (DIPROLENE-AF) 0.05 % cream - Left Ankle - Anterior, Right Ankle - Anterior     I, Lavonna Monarch, MD, have reviewed all documentation for this visit.  The documentation on 01/01/20 for the exam, diagnosis, procedures, and orders are all accurate and complete.

## 2020-02-10 DIAGNOSIS — C61 Malignant neoplasm of prostate: Secondary | ICD-10-CM | POA: Diagnosis not present

## 2020-02-21 DIAGNOSIS — R3914 Feeling of incomplete bladder emptying: Secondary | ICD-10-CM | POA: Diagnosis not present

## 2020-02-21 DIAGNOSIS — R350 Frequency of micturition: Secondary | ICD-10-CM | POA: Diagnosis not present

## 2020-02-21 DIAGNOSIS — C61 Malignant neoplasm of prostate: Secondary | ICD-10-CM | POA: Diagnosis not present

## 2020-03-21 DIAGNOSIS — Z961 Presence of intraocular lens: Secondary | ICD-10-CM | POA: Diagnosis not present

## 2020-03-21 DIAGNOSIS — H26492 Other secondary cataract, left eye: Secondary | ICD-10-CM | POA: Diagnosis not present

## 2020-03-29 DIAGNOSIS — H26492 Other secondary cataract, left eye: Secondary | ICD-10-CM | POA: Diagnosis not present

## 2020-05-22 DIAGNOSIS — N5201 Erectile dysfunction due to arterial insufficiency: Secondary | ICD-10-CM | POA: Diagnosis not present

## 2020-05-22 DIAGNOSIS — R3912 Poor urinary stream: Secondary | ICD-10-CM | POA: Diagnosis not present

## 2020-05-22 DIAGNOSIS — R3915 Urgency of urination: Secondary | ICD-10-CM | POA: Diagnosis not present

## 2020-05-22 DIAGNOSIS — C61 Malignant neoplasm of prostate: Secondary | ICD-10-CM | POA: Diagnosis not present

## 2020-05-22 DIAGNOSIS — R350 Frequency of micturition: Secondary | ICD-10-CM | POA: Diagnosis not present

## 2020-06-12 DIAGNOSIS — N400 Enlarged prostate without lower urinary tract symptoms: Secondary | ICD-10-CM | POA: Diagnosis not present

## 2020-06-12 DIAGNOSIS — F5221 Male erectile disorder: Secondary | ICD-10-CM | POA: Diagnosis not present

## 2020-06-12 DIAGNOSIS — I10 Essential (primary) hypertension: Secondary | ICD-10-CM | POA: Diagnosis not present

## 2020-06-12 DIAGNOSIS — M5431 Sciatica, right side: Secondary | ICD-10-CM | POA: Diagnosis not present

## 2020-06-12 DIAGNOSIS — C61 Malignant neoplasm of prostate: Secondary | ICD-10-CM | POA: Diagnosis not present

## 2020-06-12 DIAGNOSIS — E7800 Pure hypercholesterolemia, unspecified: Secondary | ICD-10-CM | POA: Diagnosis not present

## 2020-06-12 DIAGNOSIS — R739 Hyperglycemia, unspecified: Secondary | ICD-10-CM | POA: Diagnosis not present

## 2020-06-12 DIAGNOSIS — M722 Plantar fascial fibromatosis: Secondary | ICD-10-CM | POA: Diagnosis not present

## 2020-08-23 DIAGNOSIS — C61 Malignant neoplasm of prostate: Secondary | ICD-10-CM | POA: Diagnosis not present

## 2020-08-30 DIAGNOSIS — N401 Enlarged prostate with lower urinary tract symptoms: Secondary | ICD-10-CM | POA: Diagnosis not present

## 2020-08-30 DIAGNOSIS — R350 Frequency of micturition: Secondary | ICD-10-CM | POA: Diagnosis not present

## 2020-08-30 DIAGNOSIS — C61 Malignant neoplasm of prostate: Secondary | ICD-10-CM | POA: Diagnosis not present

## 2020-08-30 DIAGNOSIS — N5201 Erectile dysfunction due to arterial insufficiency: Secondary | ICD-10-CM | POA: Diagnosis not present

## 2020-09-03 ENCOUNTER — Encounter (HOSPITAL_COMMUNITY): Payer: Self-pay

## 2020-09-03 ENCOUNTER — Encounter (HOSPITAL_COMMUNITY): Payer: Self-pay | Admitting: Emergency Medicine

## 2020-09-03 ENCOUNTER — Other Ambulatory Visit: Payer: Self-pay

## 2020-09-03 ENCOUNTER — Emergency Department (HOSPITAL_COMMUNITY): Payer: PPO

## 2020-09-03 ENCOUNTER — Inpatient Hospital Stay (HOSPITAL_COMMUNITY)
Admission: EM | Admit: 2020-09-03 | Discharge: 2020-09-05 | DRG: 339 | Disposition: A | Discharge status: Home / Self Care | Payer: PPO | Attending: Surgery | Admitting: Surgery

## 2020-09-03 ENCOUNTER — Ambulatory Visit (HOSPITAL_COMMUNITY)
Admission: EM | Admit: 2020-09-03 | Discharge: 2020-09-03 | Disposition: A | Discharge status: Other Acute Inpatient Hospital | Payer: PPO

## 2020-09-03 DIAGNOSIS — R1013 Epigastric pain: Secondary | ICD-10-CM

## 2020-09-03 DIAGNOSIS — R140 Abdominal distension (gaseous): Secondary | ICD-10-CM | POA: Diagnosis not present

## 2020-09-03 DIAGNOSIS — R109 Unspecified abdominal pain: Secondary | ICD-10-CM | POA: Diagnosis present

## 2020-09-03 DIAGNOSIS — E785 Hyperlipidemia, unspecified: Secondary | ICD-10-CM | POA: Diagnosis present

## 2020-09-03 DIAGNOSIS — K353 Acute appendicitis with localized peritonitis, without perforation or gangrene: Secondary | ICD-10-CM

## 2020-09-03 DIAGNOSIS — L409 Psoriasis, unspecified: Secondary | ICD-10-CM | POA: Diagnosis present

## 2020-09-03 DIAGNOSIS — I10 Essential (primary) hypertension: Secondary | ICD-10-CM | POA: Diagnosis not present

## 2020-09-03 DIAGNOSIS — K449 Diaphragmatic hernia without obstruction or gangrene: Secondary | ICD-10-CM | POA: Diagnosis not present

## 2020-09-03 DIAGNOSIS — H9190 Unspecified hearing loss, unspecified ear: Secondary | ICD-10-CM | POA: Diagnosis present

## 2020-09-03 DIAGNOSIS — N400 Enlarged prostate without lower urinary tract symptoms: Secondary | ICD-10-CM | POA: Diagnosis not present

## 2020-09-03 DIAGNOSIS — K219 Gastro-esophageal reflux disease without esophagitis: Secondary | ICD-10-CM | POA: Diagnosis not present

## 2020-09-03 DIAGNOSIS — M1990 Unspecified osteoarthritis, unspecified site: Secondary | ICD-10-CM | POA: Diagnosis present

## 2020-09-03 DIAGNOSIS — Z20822 Contact with and (suspected) exposure to covid-19: Secondary | ICD-10-CM | POA: Diagnosis not present

## 2020-09-03 DIAGNOSIS — Z8673 Personal history of transient ischemic attack (TIA), and cerebral infarction without residual deficits: Secondary | ICD-10-CM | POA: Diagnosis not present

## 2020-09-03 DIAGNOSIS — Z85828 Personal history of other malignant neoplasm of skin: Secondary | ICD-10-CM | POA: Diagnosis not present

## 2020-09-03 DIAGNOSIS — K5730 Diverticulosis of large intestine without perforation or abscess without bleeding: Secondary | ICD-10-CM | POA: Diagnosis not present

## 2020-09-03 DIAGNOSIS — Z880 Allergy status to penicillin: Secondary | ICD-10-CM

## 2020-09-03 DIAGNOSIS — Z888 Allergy status to other drugs, medicaments and biological substances status: Secondary | ICD-10-CM

## 2020-09-03 DIAGNOSIS — R9431 Abnormal electrocardiogram [ECG] [EKG]: Secondary | ICD-10-CM

## 2020-09-03 DIAGNOSIS — K352 Acute appendicitis with generalized peritonitis, without abscess: Secondary | ICD-10-CM | POA: Diagnosis present

## 2020-09-03 DIAGNOSIS — Z8249 Family history of ischemic heart disease and other diseases of the circulatory system: Secondary | ICD-10-CM | POA: Diagnosis not present

## 2020-09-03 DIAGNOSIS — Z8546 Personal history of malignant neoplasm of prostate: Secondary | ICD-10-CM | POA: Diagnosis not present

## 2020-09-03 DIAGNOSIS — C181 Malignant neoplasm of appendix: Principal | ICD-10-CM | POA: Diagnosis present

## 2020-09-03 DIAGNOSIS — K7689 Other specified diseases of liver: Secondary | ICD-10-CM | POA: Diagnosis not present

## 2020-09-03 DIAGNOSIS — K37 Unspecified appendicitis: Secondary | ICD-10-CM | POA: Diagnosis present

## 2020-09-03 DIAGNOSIS — K3580 Unspecified acute appendicitis: Secondary | ICD-10-CM | POA: Diagnosis not present

## 2020-09-03 LAB — COMPREHENSIVE METABOLIC PANEL
ALT: 30 U/L (ref 0–44)
AST: 24 U/L (ref 15–41)
Albumin: 4 g/dL (ref 3.5–5.0)
Alkaline Phosphatase: 71 U/L (ref 38–126)
Anion gap: 10 (ref 5–15)
BUN: 14 mg/dL (ref 8–23)
CO2: 26 mmol/L (ref 22–32)
Calcium: 9.5 mg/dL (ref 8.9–10.3)
Chloride: 106 mmol/L (ref 98–111)
Creatinine, Ser: 0.92 mg/dL (ref 0.61–1.24)
GFR, Estimated: >60 mL/min (ref >60)
Glucose, Bld: 128 mg/dL — ABNORMAL HIGH (ref 70–99)
Potassium: 3.7 mmol/L (ref 3.5–5.1)
Sodium: 142 mmol/L (ref 135–145)
Total Bilirubin: 1.5 mg/dL — ABNORMAL HIGH (ref 0.3–1.2)
Total Protein: 6.7 g/dL (ref 6.5–8.1)

## 2020-09-03 LAB — CBC
HCT: 45.1 % (ref 39.0–52.0)
Hemoglobin: 14.9 g/dL (ref 13.0–17.0)
MCH: 31.5 pg (ref 26.0–34.0)
MCHC: 33 g/dL (ref 30.0–36.0)
MCV: 95.3 fL (ref 80.0–100.0)
Platelets: 161 10*3/uL (ref 150–400)
RBC: 4.73 MIL/uL (ref 4.22–5.81)
RDW: 12.1 % (ref 11.5–15.5)
WBC: 12.3 10*3/uL — ABNORMAL HIGH (ref 4.0–10.5)
nRBC: 0 % (ref 0.0–0.2)

## 2020-09-03 LAB — RESP PANEL BY RT-PCR (FLU A&B, COVID) ARPGX2
Influenza A by PCR: NEGATIVE
Influenza B by PCR: NEGATIVE
SARS Coronavirus 2 by RT PCR: NEGATIVE

## 2020-09-03 LAB — LIPASE, BLOOD: Lipase: 100 U/L — ABNORMAL HIGH (ref 11–51)

## 2020-09-03 MED ORDER — LIDOCAINE VISCOUS HCL 2 % MT SOLN: 15.0000 mL | Freq: Once | OROMUCOSAL | Status: DC

## 2020-09-03 MED ORDER — ALUM & MAG HYDROXIDE-SIMETH 200-200-20 MG/5ML PO SUSP
ORAL | Status: AC
  Filled 2020-09-03: qty 30

## 2020-09-03 MED ORDER — AMLODIPINE BESYLATE 5 MG PO TABS
5.0000 mg | ORAL_TABLET | Freq: Every day | ORAL | Status: DC
  Administered 2020-09-05: 5 mg via ORAL
  Filled 2020-09-03 (×2): qty 1

## 2020-09-03 MED ORDER — IOHEXOL 350 MG/ML SOLN
75.0000 mL | Freq: Once | INTRAVENOUS | Status: AC | PRN
  Administered 2020-09-03: 75 mL via INTRAVENOUS

## 2020-09-03 MED ORDER — ONDANSETRON HCL 4 MG/2ML IJ SOLN: 4.0000 mg | Freq: Four times a day (QID) | INTRAMUSCULAR | Status: DC | PRN

## 2020-09-03 MED ORDER — ALUM & MAG HYDROXIDE-SIMETH 200-200-20 MG/5ML PO SUSP: 30.0000 mL | Freq: Once | ORAL | Status: DC

## 2020-09-03 MED ORDER — DOXAZOSIN MESYLATE 8 MG PO TABS
8.0000 mg | ORAL_TABLET | Freq: Every day | ORAL | Status: DC
  Administered 2020-09-04 (×2): 8 mg via ORAL
  Filled 2020-09-03 (×3): qty 1

## 2020-09-03 MED ORDER — OXYCODONE HCL 5 MG PO TABS: 5.0000 mg | ORAL_TABLET | ORAL | Status: DC | PRN

## 2020-09-03 MED ORDER — POTASSIUM CHLORIDE IN NACL 20-0.9 MEQ/L-% IV SOLN
INTRAVENOUS | Status: DC
  Filled 2020-09-03 (×2): qty 1000

## 2020-09-03 MED ORDER — MORPHINE SULFATE (PF) 4 MG/ML IV SOLN: 4.0000 mg | INTRAVENOUS | Status: DC | PRN

## 2020-09-03 MED ORDER — PANTOPRAZOLE SODIUM 40 MG IV SOLR
40.0000 mg | Freq: Every day | INTRAVENOUS | Status: DC
  Administered 2020-09-03 – 2020-09-04 (×2): 40 mg via INTRAVENOUS
  Filled 2020-09-03 (×2): qty 40

## 2020-09-03 MED ORDER — CIPROFLOXACIN IN D5W 400 MG/200ML IV SOLN
400.0000 mg | Freq: Two times a day (BID) | INTRAVENOUS | Status: DC
  Administered 2020-09-04 – 2020-09-05 (×3): 400 mg via INTRAVENOUS
  Filled 2020-09-03 (×4): qty 200

## 2020-09-03 MED ORDER — BENAZEPRIL HCL 5 MG PO TABS
40.0000 mg | ORAL_TABLET | Freq: Every day | ORAL | Status: DC
  Administered 2020-09-03 – 2020-09-04 (×2): 40 mg via ORAL
  Filled 2020-09-03 (×2): qty 8

## 2020-09-03 MED ORDER — ACETAMINOPHEN 650 MG RE SUPP: 650.0000 mg | Freq: Four times a day (QID) | RECTAL | Status: DC | PRN

## 2020-09-03 MED ORDER — CIPROFLOXACIN IN D5W 400 MG/200ML IV SOLN
400.0000 mg | Freq: Once | INTRAVENOUS | Status: AC
  Administered 2020-09-03: 400 mg via INTRAVENOUS
  Filled 2020-09-03: qty 200

## 2020-09-03 MED ORDER — LIDOCAINE VISCOUS HCL 2 % MT SOLN
OROMUCOSAL | Status: AC
  Filled 2020-09-03: qty 15

## 2020-09-03 MED ORDER — ONDANSETRON 4 MG PO TBDP: 4.0000 mg | ORAL_TABLET | Freq: Four times a day (QID) | ORAL | Status: DC | PRN

## 2020-09-03 MED ORDER — ACETAMINOPHEN 325 MG PO TABS: 650.0000 mg | ORAL_TABLET | Freq: Four times a day (QID) | ORAL | Status: DC | PRN

## 2020-09-03 MED ORDER — METRONIDAZOLE 500 MG/100ML IV SOLN
500.0000 mg | Freq: Three times a day (TID) | INTRAVENOUS | Status: DC
  Administered 2020-09-03 – 2020-09-05 (×5): 500 mg via INTRAVENOUS
  Filled 2020-09-03 (×5): qty 100

## 2020-09-03 NOTE — ED Provider Notes (Signed)
[facility] EMERGENCY DEPARTMENT Provider Note   CSN: [id number] Arrival date & time: 09/03/20  1206     History No chief complaint on file.   Chad Ford is a 80 y.o. male.  He was seen at urgent care, and there was some concern that his EKG was changed from his prior.  Therefore he was sent in.  The history is provided by the patient.  Abdominal Pain Pain location:  Epigastric Pain quality: bloating and sharp   Pain radiates to:  Does not radiate Pain severity:  Moderate Onset quality:  Sudden Duration:  1 day (started last night) Progression:  Worsening Chronicity:  New Context: not diet changes, not recent illness and not sick contacts   Relieved by:  Nothing Worsened by:  Nothing Ineffective treatments:  Antacids Associated symptoms: no chest pain, no chills, no constipation, no cough, no diarrhea, no dysuria, no fever, no hematuria, no shortness of breath, no sore throat and no vomiting       Past Medical History:  Diagnosis Date   Anxiety    Arthritis    Atypical mole 02/06/2017   Left Upper Back (moderate) (recheck 12month)   BPH (benign prostatic hyperplasia)    Cancer (HCC)    Skin cancer- face and arm - basal   GERD (gastroesophageal reflux disease)    HOH (hard of hearing)    Hyperlipidemia    Hypertension    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 [place] 08/[place]    Past Surgical History:  Procedure  Laterality Date   CIRCUMCISION     COLONOSCOPY     COLONOSCOPY W/ POLYPECTOMY     HEMORRHOID SURGERY     HERNIA REPAIR Right    Inguinal   INGUINAL HERNIA REPAIR Left 12/31/2015   Procedure: HERNIA REPAIR INGUINAL ADULT;  Surgeon: BGeorganna Skeans MD;  [facility]  Service: General;  Laterality: Left;   INSERTION OF MESH Left 12/31/2015   Procedure: INSERTION OF MESH;  Surgeon: BGeorganna Skeans MD;  Location: [facility]  Service: General;  Laterality: Left;   RADIOACTIVE SEED IMPLANT N/A 10/28/2019   Procedure: RADIOACTIVE SEED IMPLANT/BRACHYTHERAPY IMPLANT;  Surgeon: GJanith Lima MD;  [facility]  Service: Urology;  Laterality: N/A;   SPACE OAR INSTILLATION N/A 10/28/2019   Procedure: SPACE OAR INSTILLATION;  Surgeon: GJanith Lima MD;  [facility]  Service: Urology;  Laterality: N/A;   TONSILLECTOMY         Family History  Problem Relation Age of Onset   Hypertension Mother    Hypertension Father     Social History   Tobacco Use   Smoking status: Never   Smokeless tobacco: Never  Vaping Use   Vaping Use: Never used  Substance Use Topics   Alcohol use: No   Drug use: No    Home Medications Prior to Admission medications  Medication Sig Start Date End Date Taking? Authorizing Provider  amLODipine (NORVASC) 5 MG tablet Take 2.5 mg by mouth daily.  12/08/18   [provider]  augmented betamethasone dipropionate (DIPROLENE-AF) 0.05 % cream Apply topically daily. Apply to affected area qd after bathing x 3 weeks 12/20/19   Lavonna Monarch, MD  benazepril (LOTENSIN) 40 MG tablet Take 40 mg by mouth at bedtime.    [provider]  calcium carbonate (TUMS - DOSED IN MG ELEMENTAL CALCIUM) 500 MG chewable tablet Chew 1 tablet by mouth as needed for indigestion or heartburn.    [provider]  ciclopirox (LOPROX) 0.77 % cream Apply 1 application topically at bedtime. Apply to feet every other nite     [provider]  diazepam (VALIUM) 10 MG tablet SMARTSIG:1 Tablet(s) By Mouth Every 2 Hours PRN 10/03/19   [provider]  doxazosin (CARDURA) 8 MG tablet Take 8 mg by mouth at bedtime.    [provider]  Naproxen Sodium (ALEVE PO) Take by mouth.    [provider]  oxyCODONE-acetaminophen (PERCOCET) 5-325 MG tablet Take 1 tablet by mouth every 4 (four) hours as needed for up to 6 doses for severe pain. Patient not taking: Reported on 09/03/2020 10/28/19   Janith Lima, MD  pravastatin (PRAVACHOL) 40 MG tablet Take 40 mg by mouth at bedtime.    [provider]  tadalafil (CIALIS) 20 MG tablet 1 po 30 minutes before intercourse 06/12/15   [provider]    Allergies    Penicillins  Review of Systems   Review of Systems  Constitutional:  Negative for chills and fever.  HENT:  Negative for ear pain and sore throat.   Eyes:  Negative for pain and visual disturbance.  Respiratory:  Negative for cough and shortness of breath.   Cardiovascular:  Negative for chest pain and palpitations.  Gastrointestinal:  Positive for abdominal pain. Negative for constipation, diarrhea and vomiting.  Genitourinary:  Negative for dysuria and hematuria.  Musculoskeletal:  Negative for arthralgias and back pain.  Skin:  Negative for color change and rash.  Neurological:  Negative for seizures and syncope.  All other systems reviewed and are negative.  Physical Exam Updated Vital Signs BP (!) 142/70 (BP Location: Right Arm)   Pulse 87   Temp 98 F (36.7 C) (Oral)   Resp 18   SpO2 98%   Physical Exam Vitals and nursing note reviewed.  Constitutional:      Appearance: Normal appearance.  HENT:     Head: Normocephalic and atraumatic.  Eyes:     Conjunctiva/sclera: Conjunctivae normal.  Pulmonary:     Effort: Pulmonary effort is normal. No respiratory distress.  Abdominal:     General: There is no distension.     Tenderness: There is abdominal  tenderness (mild epigastric). There is no guarding or rebound.  Musculoskeletal:        General: No deformity. Normal range of motion.     Cervical back: Normal range of motion.  Skin:    General: Skin is warm and dry.  Neurological:     General: No focal deficit present.     Mental Status: He is alert and oriented to person, place, and time. Mental status is at baseline.  Psychiatric:        Mood and Affect: Mood normal.    ED Results / Procedures / Treatments   Labs (all labs ordered are listed, but only abnormal results are displayed) Labs Reviewed  LIPASE, BLOOD - Abnormal; Notable for the following components:      Result Value   Lipase 100 (*)    All other components within normal limits  COMPREHENSIVE METABOLIC PANEL - Abnormal; Notable for the following components:   Glucose, Bld 128 (*)    Total Bilirubin 1.5 (*)    All other components within normal limits  CBC - Abnormal; Notable for the following components:   WBC 12.3 (*)    All other components within normal limits  RESP PANEL BY RT-PCR (FLU A&B, COVID) [id number]  SURGICAL PCR SCREEN  URINALYSIS, ROUTINE W REFLEX MICROSCOPIC  BASIC METABOLIC PANEL  CBC    EKG None  Radiology CT Abdomen Pelvis W Contrast  Result Date: 09/03/2020 CLINICAL DATA:  Abdominal infection. EXAM: CT ABDOMEN AND PELVIS WITH CONTRAST TECHNIQUE: Multidetector CT imaging of the abdomen and pelvis was performed using the standard protocol following bolus administration of intravenous contrast. CONTRAST:  63m OMNIPAQUE IOHEXOL 350 MG/ML SOLN COMPARISON:  None. FINDINGS: Lower chest: Minimal bibasilar dependent atelectasis. The visualized lung bases are otherwise clear. No intra-abdominal free air or free fluid. Hepatobiliary: Several liver cysts measure up to and 4 cm in the right lobe of the liver. Multiple subcentimeter hypodensities are too small to characterize. No intrahepatic biliary ductal dilatation. The gallbladder is unremarkable.  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 This can be better characterized with MRI on a nonemergent/outpatient basis. No dilatation of the main pancreatic duct or gland atrophy. No active inflammatory changes. Spleen: Small scattered calcified splenic granuloma. Adrenals/Urinary Tract: The adrenal glands are unremarkable. There is no hydronephrosis on either side. There is symmetric enhancement and excretion of contrast by both kidneys. Small bilateral parapelvic cysts as well as probable small cyst in the upper pole of the right kidney. The visualized ureters and the urinary bladder appear unremarkable. Stomach/Bowel: Small hiatal hernia. There is moderate stool throughout the colon. There is sigmoid diverticulosis without active inflammatory changes. The appendix is enlarged and inflamed. The tip of the appendix measures up to 17 mm in diameter. The appendix is located posterior to the cecum in the right lower quadrant and extends superiorly posterior to the ascending colon to the subhepatic region. No drainable fluid collection/abscess. No evidence of perforation. Vascular/Lymphatic: Moderate aortoiliac atherosclerotic disease. The IVC is unremarkable. No portal venous gas. There is no adenopathy. Reproductive: The prostate gland is enlarged. Prostate brachytherapy seeds noted. Other: Probable prior repair of right inguinal hernia. There is slight protrusion of a short segment of distal small bowel. No obstruction. Musculoskeletal: Osteopenia with degenerative changes of the spine. No acute osseous pathology. IMPRESSION: 1. Acute appendicitis. No abscess or perforation. 2. Sigmoid diverticulosis. 3. Indeterminate 5 mm low attenuating/cystic lesion involving the posterior aspect of the uncinate process of the  pancreas. This can be better characterized with MRI on a nonemergent/outpatient basis. 4. Aortic Atherosclerosis (ICD10-I70.0). Electronically Signed   By: AAnner CreteM.D.   On: 09/03/2020 19:44    Procedures Procedures   Medications Ordered in ED Medications - No data to display  ED Course  I have reviewed the triage vital signs and the nursing notes.  Pertinent labs & imaging results that were available during my care of the patient were reviewed by me and considered in my medical decision making (see chart for details).  Clinical Course as of 09/03/20 2334  Mon Sep 03, 2020  2051 I spoke with Dr. Grandville Silos who will admit the patient. [AW]    Clinical Course User Index [AW] Arnaldo Natal, MD   MDM Rules/Calculators/A&P                           Jetty Duhamel presented with upper abdominal pain.  He was actually quite comfortable when I saw him, and because I already had his labs back, I was considering pancreatitis or gastritis is the most likely etiology of his symptoms.  His lipase was very mildly elevated.  However, surprisingly, his CT scan revealed acute appendicitis.  He was given antibiotics and will be admitted for further intervention.  He was initially sent to the ED from urgent care for evaluation of possible ACS.  Given the patient's clear tenderness to palpation in his abdomen and the finding of appendicitis on his CT scan, I do not think this is the most likely diagnosis.  No further work-up with respect to this disease process was performed. Final Clinical Impression(s) / ED Diagnoses Final diagnoses:  Acute appendicitis with localized peritonitis, without perforation, abscess, or gangrene    Rx / DC Orders ED Discharge Orders     None        Arnaldo Natal, MD 09/03/20 2336

## 2020-09-03 NOTE — ED Notes (Signed)
Patient transported to CT 

## 2020-09-03 NOTE — ED Triage Notes (Signed)
Generalized abdominal pain.  Abdominal pain started after eating last night.  Patient reports feeling bloated.  Denies diarrhea, no vomiting.  Unable to sleep last night due to pain.  Feels like he needs to burp and unable to do so

## 2020-09-03 NOTE — ED Provider Notes (Signed)
[facility]    CSN: [id number] Arrival date & time: 09/03/20  1003      History   Chief Complaint Chief Complaint  Patient presents with   Abdominal Pain    HPI Chad Ford is a 80 y.o. male.   Patient presenting today with burning upper abdominal pain, bloating and distention that has been constant and has kept him up all night.  Started after eating dinner last night.  He denies any fever, chills, nausea, vomiting, bowel changes, urinary symptoms, back pain, chest pain, shortness of breath, diaphoresis, dizziness.  So far has tried Tubbs in good mood usually with water with no relief.  States he has had some issues with indigestion in the past but usually Tums resolves those episodes and this feels much more severe than anything that he has had in the past.  Past medical history significant for prostate cancer in remission, GERD, hyperlipidemia, hypertension, history of CVA.  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   Right Forearm (Keratoacanthoma) (curet and 5FU)   [place]roke ([facility]    Mini strokes- no symptoms   Superficial basal cell carcinoma (BCC) 06/16/2019   Left Upper Arm - Anterior   Vertigo    treats with specific exercises    Patient Active Problem List   Diagnosis Date Noted   Malignant neoplasm of prostate ([facility] 08/26/2019    Past Surgical History:  Procedure Laterality Date   CIRCUMCISION     COLONOSCOPY      COLONOSCOPY W/ POLYPECTOMY     HEMORRHOID SURGERY     HERNIA REPAIR Right    Inguinal   INGUINAL HERNIA REPAIR Left 12/31/2015   Procedure: HERNIA REPAIR INGUINAL ADULT;  Surgeon: BGeorganna Skeans MD;  [facility]  Service: General;  Laterality: Left;   INSERTION OF MESH Left 12/31/2015   Procedure: INSERTION OF MESH;  Surgeon: BGeorganna Skeans MD;  [facility]  Service: General;  Laterality: Left;   RADIOACTIVE SEED IMPLANT N/A 10/28/2019   Procedure: RADIOACTIVE SEED IMPLANT/BRACHYTHERAPY IMPLANT;  Surgeon: GJanith Lima MD;  [facility]  Service: Urology;  Laterality: N/A;   SPACE OAR INSTILLATION N/A 10/28/2019   Procedure: SPACE OAR INSTILLATION;  Surgeon: GJanith Lima MD;  [facility]  Service: Urology;  Laterality: N/A;   TONSILLECTOMY       Home Medications    Prior to Admission medications   Medication Sig Start Date End Date Taking? Authorizing Provider  amLODipine (NORVASC) 5 MG tablet Take 2.5 mg by mouth daily.  12/08/18  Yes [provider]  benazepril (LOTENSIN) 40 MG tablet Take 40 mg by mouth at bedtime.   Yes [provider]  doxazosin (CARDURA) 8 MG tablet Take 8 mg by mouth at bedtime.   Yes [provider]  Naproxen Sodium (ALEVE PO) Take  by mouth.   Yes [provider]  pravastatin (PRAVACHOL) 40 MG tablet Take 40 mg by mouth at bedtime.   Yes [provider]  augmented betamethasone dipropionate (DIPROLENE-AF) 0.05 % cream Apply topically daily. Apply to affected area qd after bathing x 3 weeks 12/20/19   Lavonna Monarch, MD  calcium carbonate (TUMS - DOSED IN MG ELEMENTAL CALCIUM) 500 MG chewable tablet Chew 1 tablet by mouth as needed for indigestion or heartburn.    [provider]  ciclopirox (LOPROX) 0.77 % cream Apply 1 application topically at bedtime. Apply to feet every other nite    [provider]  diazepam (VALIUM) 10 MG tablet  SMARTSIG:1 Tablet(s) By Mouth Every 2 Hours PRN 10/03/19   [provider]  oxyCODONE-acetaminophen (PERCOCET) 5-325 MG tablet Take 1 tablet by mouth every 4 (four) hours as needed for up to 6 doses for severe pain. Patient not taking: Reported on 09/03/2020 10/28/19   Janith Lima, MD  tadalafil (CIALIS) 20 MG tablet 1 po 30 minutes before intercourse 06/12/15   [provider]    Family History Family History  Problem Relation Age of Onset   Hypertension Mother    Hypertension Father     Social History Social History   Tobacco Use   Smoking status: Never   Smokeless tobacco: Never  Vaping Use   Vaping Use: Never used  Substance Use Topics   Alcohol use: No   Drug use: No     Allergies   Penicillins   Review of Systems Review of Systems Per HPI  Physical Exam Triage Vital Signs ED Triage Vitals  Enc Vitals Group     BP 09/03/20 1021 (!) 142/69     Pulse Rate 09/03/20 1021 (!) 102     Resp 09/03/20 1021 20     Temp 09/03/20 1021 98.3 F (36.8 C)     Temp Source 09/03/20 1021 Oral     SpO2 09/03/20 1021 97 %     Weight --      Height --      Head Circumference --      Peak Flow --      Pain Score 09/03/20 1023 5     Pain Loc --      Pain Edu? --      Excl. in [place]? --    No data found.  Updated Vital Signs BP (!) 142/69 (BP Location: Right Arm)   Pulse (!) 102   Temp 98.3 F (36.8 C) (Oral)   Resp 20   SpO2 97%   Visual Acuity Right Eye Distance:   Left Eye Distance:   Bilateral Distance:    Right Eye Near:   Left Eye Near:    Bilateral Near:     Physical Exam Vitals and nursing note reviewed.  Constitutional:      Appearance: Normal appearance.  HENT:     Head: Atraumatic.     Mouth/Throat:     Mouth: Mucous membranes are moist.     Pharynx: Oropharynx is clear.  Eyes:     Extraocular Movements: Extraocular movements intact.     Conjunctiva/sclera: Conjunctivae normal.  Cardiovascular:     Rate and Rhythm: Normal  rate and regular rhythm.     Heart sounds: Normal heart sounds.  Pulmonary:     Effort: Pulmonary effort is normal. No respiratory distress.     Breath sounds: Normal breath sounds. No wheezing or rales.  Abdominal:     General:  Bowel sounds are normal. There is no distension.     Palpations: Abdomen is soft. There is no mass.     Tenderness: There is abdominal tenderness. There is no right CVA tenderness, left CVA tenderness, guarding or rebound.     Comments: Abdomen soft, bowel sounds normoactive, epigastric region without significant tenderness to palpation but patient states it is painful at a 6 out of 10 without palpation.  Mild generalized tenderness palpation otherwise cross abdomen.  Musculoskeletal:        General: Normal range of motion.     Cervical back: Normal range of motion and neck supple.  Skin:    General: Skin is warm and dry.     Findings: No bruising, erythema or rash.  Neurological:     General: No focal deficit present.     Mental Status: He is oriented to person, place, and time.     Motor: No weakness.     Gait: Gait normal.  Psychiatric:        Mood and Affect: Mood normal.        Thought Content: Thought content normal.        Judgment: Judgment normal.     UC Treatments / Results  Labs (all labs ordered are listed, but only abnormal results are displayed) Labs Reviewed - No data to display  EKG   Radiology No results found.  Procedures Procedures (including critical care time)  Medications Ordered in UC Medications - No data to display  Initial Impression / Assessment and Plan / UC Course  I have reviewed the triage vital signs and the nursing notes.  Pertinent labs & imaging results that were available during my care of the patient were reviewed by me and considered in my medical decision making (see chart for details).     Mildly hypertensive in triage, tachycardic initially but improved to 91 bpm after rest.  Given significance of  his pain keeping him up at night, worsening course, minimal relief from GI cocktail, EKG changes showing some possible inferior T wave changes as compared to last EKG on file from 2021, recommended he go to the emergency department for further evaluation to his symptoms.  He and his wife are agreeable to this and she wishes to drive him via private vehicle.  He is currently hemodynamically stable for transfer at this time.  Final Clinical Impressions(s) / UC Diagnoses   Final diagnoses:  Epigastric pain  Abdominal bloating  Abnormal EKG   Discharge Instructions   None    ED Prescriptions   None    PDMP not reviewed this encounter.   Volney American, [place] 09/03/20 1317

## 2020-09-03 NOTE — ED Triage Notes (Signed)
Patient complains of generalized abdominal pain that started last night after eating. Went to [facility] and questionable ekg per the PA.  Patient denies cp, no SOB.

## 2020-09-03 NOTE — H&P (Addendum)
Chad Ford is an 80 y.o. male.   Chief Complaint: abdominal pain HPI: 80yo M developed generalized abdominal pain and bloating yesterday.  It kept him up throughout the night last night.  No associated nausea.  He has had decreased appetite.  He has passed some gas and he now describes the pain is intermittent but shooting along his right side.  He came to the emergency department for further evaluation.  Work-up shows white blood cell count 12,300.  CT scan of the abdomen and pelvis is consistent with acute appendicitis.  I was asked to see him for surgical management.  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   Right Forearm (Keratoacanthoma) (curet and 5FU)   [place]    Mini strokes- no symptoms   Superficial basal cell carcinoma (BCC) 06/16/2019   Left Upper Arm - Anterior   Vertigo    treats with specific exercises    Past Surgical History:  Procedure Laterality Date   CIRCUMCISION     COLONOSCOPY     COLONOSCOPY W/ POLYPECTOMY     HEMORRHOID SURGERY     HERNIA REPAIR Right    Inguinal   INGUINAL HERNIA REPAIR Left 12/31/2015   Procedure: HERNIA REPAIR INGUINAL ADULT;  Surgeon: BGeorganna Skeans MD;  [facility]  Service: General;  Laterality: Left;   INSERTION OF MESH Left 12/31/2015   Procedure: INSERTION OF MESH;  Surgeon: BGeorganna Skeans MD;  [place] [facility]  Service: General;  Laterality: Left;   RADIOACTIVE SEED IMPLANT  N/A 10/28/2019   Procedure: RADIOACTIVE SEED IMPLANT/BRACHYTHERAPY IMPLANT;  Surgeon: GJanith Lima MD;  [facility]  Service: Urology;  Laterality: N/A;   SPACE OAR INSTILLATION N/A 10/28/2019   Procedure: SPACE OAR INSTILLATION;  Surgeon: GJanith Lima MD;  [facility]  Service: Urology;  Laterality: N/A;   TONSILLECTOMY      Family History  Problem Relation Age of Onset   Hypertension Mother    Hypertension Father    Social History:  reports that he has never smoked. He has never used smokeless tobacco. He reports that he does not drink alcohol and does not use drugs.  Allergies:  Allergies  Allergen Reactions   Penicillins Hives and Swelling    SWELLING REACTION UNSPECIFIED, FROM CHILDHOOD.  Has patient had a PCN reaction causing immediate rash, facial/tongue/throat swelling, SOB or lightheadedness with hypotension: Unknown Has patient had a PCN reaction causing severe rash involving mucus membranes or skin necrosis: No Has patient had a PCN reaction that required hospitalization: No Has patient had a PCN reaction occurring within the last 10 years: No If all of the above answers are "NO", then may proceed with Cephalosporin use.     (Not in a hospital admission)   Results for orders placed or performed during the hospital encounter of  09/03/20 (from the past 48 hour(s))  Lipase, blood     Status: Abnormal   Collection Time: 09/03/20  1:15 PM  Result Value Ref Range   Lipase 100 (H) 11 - 51 U/L    Comment: Performed at [facility], [address] [place], [place] [address]  Comprehensive metabolic panel     Status: Abnormal   Collection Time: 09/03/20  1:15 PM  Result Value Ref Range   Sodium 142 135 - 145 mmol/L   Potassium 3.7 3.5 - 5.1 mmol/L   Chloride 106 98 - 111 mmol/L   CO2 26 22 - 32 mmol/L   Glucose, Bld 128 (H) 70 - 99 mg/dL    Comment: Glucose reference range applies only to samples taken  after fasting for at least 8 hours.   BUN 14 8 - 23 mg/dL   Creatinine, Ser 0.92 0.61 - 1.24 mg/dL   Calcium 9.5 8.9 - 10.3 mg/dL   Total Protein 6.7 6.5 - 8.1 g/dL   Albumin 4.0 3.5 - 5.0 g/dL   AST 24 15 - 41 U/L   ALT 30 0 - 44 U/L   Alkaline Phosphatase 71 38 - 126 U/L   Total Bilirubin 1.5 (H) 0.3 - 1.2 mg/dL   GFR, Estimated >60 >60 mL/min    Comment: (NOTE) Calculated using the CKD-EPI Creatinine Equation (2021)    Anion gap 10 5 - 15    Comment: Performed at [facility] [address] [place], [place] [address]  CBC     Status: Abnormal   Collection Time: 09/03/20  1:15 PM  Result Value Ref Range   WBC 12.3 (H) 4.0 - 10.5 K/uL   RBC 4.73 4.22 - 5.81 MIL/uL   Hemoglobin 14.9 13.0 - 17.0 g/dL   HCT 45.1 39.0 - 52.0 %   MCV 95.3 80.0 - 100.0 fL   MCH 31.5 26.0 - 34.0 pg   MCHC 33.0 30.0 - 36.0 g/dL   RDW 12.1 11.5 - 15.5 %   Platelets 161 150 - 400 K/uL   nRBC 0.0 0.0 - 0.2 %    Comment: Performed at [facility], [place] [address] [place],  [address]   CT Abdomen Pelvis W Contrast  Result Date: 09/03/2020 CLINICAL DATA:  Abdominal infection. EXAM: CT ABDOMEN AND PELVIS WITH CONTRAST TECHNIQUE: Multidetector CT imaging of the abdomen and pelvis was performed using the standard protocol following bolus administration of intravenous contrast. CONTRAST:  17m OMNIPAQUE IOHEXOL 350 MG/ML SOLN COMPARISON:  None. FINDINGS: Lower chest: Minimal bibasilar dependent atelectasis. The visualized lung bases are otherwise clear. No intra-abdominal free air or free fluid. Hepatobiliary: Several liver cysts measure up to and 4 cm in the right lobe of the liver. Multiple subcentimeter hypodensities are too small to characterize. No intrahepatic biliary ductal dilatation. The gallbladder is unremarkable. 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 This can be better characterized with MRI on a nonemergent/outpatient basis. No dilatation of the main pancreatic duct or gland atrophy. No active inflammatory changes. Spleen: Small scattered calcified splenic granuloma. Adrenals/Urinary Tract: The adrenal glands are unremarkable. There is no hydronephrosis on either side. There is symmetric enhancement and excretion of contrast by both kidneys. Small bilateral parapelvic cysts as well as probable small cyst in the upper pole of  the right kidney. The visualized ureters and the urinary bladder appear unremarkable. Stomach/Bowel: Small hiatal hernia. There is moderate stool throughout the colon. There is sigmoid diverticulosis without active inflammatory changes. The appendix is enlarged and inflamed. The tip of the appendix measures up to 17 mm in diameter. The appendix is located posterior to the cecum in the right lower quadrant and extends superiorly posterior to the ascending colon to the subhepatic region. No drainable fluid collection/abscess. No evidence of perforation. Vascular/Lymphatic: Moderate aortoiliac atherosclerotic disease. The IVC is unremarkable. No portal venous gas. There is no adenopathy. Reproductive: The prostate gland is enlarged. Prostate brachytherapy seeds noted. Other: Probable prior repair of right inguinal hernia. There is slight protrusion of a short segment of distal small bowel. No obstruction. Musculoskeletal: Osteopenia with degenerative changes of the spine. No acute osseous pathology. IMPRESSION: 1. Acute appendicitis. No abscess or perforation. 2. Sigmoid diverticulosis. 3. Indeterminate 5 mm low attenuating/cystic lesion involving the posterior aspect of the uncinate process of the pancreas. This can be better characterized with MRI on a nonemergent/outpatient basis. 4. Aortic Atherosclerosis (ICD10-I70.0). Electronically Signed   By: Anner Crete  M.D.   On: 09/03/2020 19:44    Review of Systems  Constitutional:  Positive for appetite change.  HENT: Negative.    Eyes: Negative.   Respiratory: Negative.    Cardiovascular: Negative.   Gastrointestinal:  Positive for abdominal pain. Negative for diarrhea and nausea.  Endocrine: Negative.   Genitourinary: Negative.   Musculoskeletal: Negative.   Skin: Negative.   Allergic/Immunologic: Negative.   Neurological: Negative.   Hematological: Negative.   Psychiatric/Behavioral: Negative.     Blood pressure 130/67, pulse 67, temperature 98 F (36.7 C), temperature source Oral, resp. rate 17, SpO2 96 %. Physical Exam Constitutional:      Appearance: Normal appearance.  HENT:     Head: Normocephalic.     Right Ear: External ear normal.     Left Ear: External ear normal.     Nose: Nose normal.     Mouth/Throat:     Mouth: Mucous membranes are moist.  Eyes:     General: No scleral icterus.    Pupils: Pupils are equal, round, and reactive to light.  Cardiovascular:     Rate and Rhythm: Normal rate and regular rhythm.     Heart sounds: No murmur heard. Pulmonary:     Effort: Pulmonary effort is normal.     Breath sounds: Normal breath sounds. No wheezing or rales.  Abdominal:     General: Abdomen is flat.     Palpations: Abdomen is soft.     Tenderness: abdominal tenderness There is no guarding or rebound.     Hernia: No hernia is present.     Comments: Tenderness right lateral abdomen along the costal margin without peritonitis  Musculoskeletal:        General: No swelling. Normal range of motion.     Cervical back: Normal range of motion and neck supple.  Skin:    General: Skin is warm and dry.     Capillary Refill: Capillary refill takes less than 2 seconds.  Neurological:     Mental Status: He is alert and oriented to person, place, and time.  Psychiatric:        Mood and Affect: Mood normal.     Assessment/Plan Acute appendicitis -he has been started on IV Cipro  and Flagyl in the ED.  A COVID test has been ordered.  I recommend admission with plan for laparoscopic  appendectomy in the morning.  I also offered the option of treatment with IV antibiotics.  After some discussion, he would like to proceed with surgery.  I will discuss his case further with Dr. Brantley Stage.  I discussed the procedure, risks, and the benefits and answered his questions and he is agreeable.  HTN -home meds  Zenovia Jarred, MD 09/03/2020, 9:25 PM

## 2020-09-04 ENCOUNTER — Encounter (HOSPITAL_COMMUNITY): Admission: EM | Disposition: A | Discharge status: Home / Self Care | Payer: Self-pay | Source: Home / Self Care

## 2020-09-04 ENCOUNTER — Observation Stay (HOSPITAL_COMMUNITY): Payer: PPO | Admitting: Anesthesiology

## 2020-09-04 DIAGNOSIS — H9190 Unspecified hearing loss, unspecified ear: Secondary | ICD-10-CM | POA: Diagnosis present

## 2020-09-04 DIAGNOSIS — Z85828 Personal history of other malignant neoplasm of skin: Secondary | ICD-10-CM | POA: Diagnosis not present

## 2020-09-04 DIAGNOSIS — M1990 Unspecified osteoarthritis, unspecified site: Secondary | ICD-10-CM | POA: Diagnosis present

## 2020-09-04 DIAGNOSIS — K352 Acute appendicitis with generalized peritonitis, without abscess: Secondary | ICD-10-CM | POA: Diagnosis present

## 2020-09-04 DIAGNOSIS — R109 Unspecified abdominal pain: Secondary | ICD-10-CM | POA: Diagnosis present

## 2020-09-04 DIAGNOSIS — N400 Enlarged prostate without lower urinary tract symptoms: Secondary | ICD-10-CM | POA: Diagnosis present

## 2020-09-04 DIAGNOSIS — E785 Hyperlipidemia, unspecified: Secondary | ICD-10-CM | POA: Diagnosis present

## 2020-09-04 DIAGNOSIS — Z880 Allergy status to penicillin: Secondary | ICD-10-CM | POA: Diagnosis not present

## 2020-09-04 DIAGNOSIS — Z20822 Contact with and (suspected) exposure to covid-19: Secondary | ICD-10-CM | POA: Diagnosis present

## 2020-09-04 DIAGNOSIS — C181 Malignant neoplasm of appendix: Secondary | ICD-10-CM | POA: Diagnosis present

## 2020-09-04 DIAGNOSIS — Z8249 Family history of ischemic heart disease and other diseases of the circulatory system: Secondary | ICD-10-CM | POA: Diagnosis not present

## 2020-09-04 DIAGNOSIS — Z8673 Personal history of transient ischemic attack (TIA), and cerebral infarction without residual deficits: Secondary | ICD-10-CM | POA: Diagnosis not present

## 2020-09-04 DIAGNOSIS — I10 Essential (primary) hypertension: Secondary | ICD-10-CM | POA: Diagnosis present

## 2020-09-04 DIAGNOSIS — L409 Psoriasis, unspecified: Secondary | ICD-10-CM | POA: Diagnosis present

## 2020-09-04 DIAGNOSIS — Z888 Allergy status to other drugs, medicaments and biological substances status: Secondary | ICD-10-CM | POA: Diagnosis not present

## 2020-09-04 DIAGNOSIS — Z8546 Personal history of malignant neoplasm of prostate: Secondary | ICD-10-CM | POA: Diagnosis not present

## 2020-09-04 DIAGNOSIS — K219 Gastro-esophageal reflux disease without esophagitis: Secondary | ICD-10-CM | POA: Diagnosis present

## 2020-09-04 HISTORY — PX: LAPAROSCOPIC APPENDECTOMY: SHX408

## 2020-09-04 LAB — CBC
HCT: 37.8 % — ABNORMAL LOW (ref 39.0–52.0)
Hemoglobin: 12.8 g/dL — ABNORMAL LOW (ref 13.0–17.0)
MCH: 31.8 pg (ref 26.0–34.0)
MCHC: 33.9 g/dL (ref 30.0–36.0)
MCV: 94 fL (ref 80.0–100.0)
Platelets: 127 10*3/uL — ABNORMAL LOW (ref 150–400)
RBC: 4.02 MIL/uL — ABNORMAL LOW (ref 4.22–5.81)
RDW: 12.3 % (ref 11.5–15.5)
WBC: 8.5 10*3/uL (ref 4.0–10.5)
nRBC: 0 % (ref 0.0–0.2)

## 2020-09-04 LAB — URINALYSIS, ROUTINE W REFLEX MICROSCOPIC
Bacteria, UA: NONE SEEN
Bilirubin Urine: NEGATIVE
Glucose, UA: NEGATIVE mg/dL
Ketones, ur: NEGATIVE mg/dL
Leukocytes,Ua: NEGATIVE
Nitrite: NEGATIVE
Protein, ur: 30 mg/dL — AB
Specific Gravity, Urine: >1.046 — ABNORMAL HIGH (ref 1.005–1.030)
pH: 5 (ref 5.0–8.0)

## 2020-09-04 LAB — SURGICAL PCR SCREEN
MRSA, PCR: NEGATIVE
Staphylococcus aureus: NEGATIVE

## 2020-09-04 LAB — BASIC METABOLIC PANEL
Anion gap: 8 (ref 5–15)
BUN: 11 mg/dL (ref 8–23)
CO2: 23 mmol/L (ref 22–32)
Calcium: 8.5 mg/dL — ABNORMAL LOW (ref 8.9–10.3)
Chloride: 106 mmol/L (ref 98–111)
Creatinine, Ser: 0.89 mg/dL (ref 0.61–1.24)
GFR, Estimated: >60 mL/min (ref >60)
Glucose, Bld: 113 mg/dL — ABNORMAL HIGH (ref 70–99)
Potassium: 3.5 mmol/L (ref 3.5–5.1)
Sodium: 137 mmol/L (ref 135–145)

## 2020-09-04 SURGERY — APPENDECTOMY, LAPAROSCOPIC
Anesthesia: General | Site: Abdomen

## 2020-09-04 MED ORDER — OXYCODONE HCL 5 MG PO TABS: 2.5000 mg | ORAL_TABLET | ORAL | Status: DC | PRN

## 2020-09-04 MED ORDER — FENTANYL CITRATE (PF) 100 MCG/2ML IJ SOLN: 25.0000 ug | INTRAMUSCULAR | Status: DC | PRN

## 2020-09-04 MED ORDER — OXYCODONE HCL 5 MG PO TABS: 5.0000 mg | ORAL_TABLET | Freq: Once | ORAL | Status: DC | PRN

## 2020-09-04 MED ORDER — FENTANYL CITRATE (PF) 250 MCG/5ML IJ SOLN
INTRAMUSCULAR | Status: DC | PRN
  Administered 2020-09-04: 25 ug via INTRAVENOUS
  Administered 2020-09-04: 50 ug via INTRAVENOUS
  Administered 2020-09-04 (×3): 25 ug via INTRAVENOUS

## 2020-09-04 MED ORDER — BUPIVACAINE-EPINEPHRINE 0.5% -1:200000 IJ SOLN
INTRAMUSCULAR | Status: DC | PRN
  Administered 2020-09-04: 6 mL

## 2020-09-04 MED ORDER — MORPHINE SULFATE (PF) 2 MG/ML IV SOLN: 2.0000 mg | INTRAVENOUS | Status: DC | PRN

## 2020-09-04 MED ORDER — BUPIVACAINE-EPINEPHRINE 0.5% -1:200000 IJ SOLN
INTRAMUSCULAR | Status: AC
  Filled 2020-09-04: qty 1

## 2020-09-04 MED ORDER — LIDOCAINE 2% (20 MG/ML) 5 ML SYRINGE
INTRAMUSCULAR | Status: DC | PRN
Start: 2020-09-04 — End: 2020-09-04
  Administered 2020-09-04: 100 mg via INTRAVENOUS

## 2020-09-04 MED ORDER — PROPOFOL 10 MG/ML IV BOLUS
INTRAVENOUS | Status: DC | PRN
  Administered 2020-09-04: 120 mg via INTRAVENOUS

## 2020-09-04 MED ORDER — ROCURONIUM BROMIDE 10 MG/ML (PF) SYRINGE
PREFILLED_SYRINGE | INTRAVENOUS | Status: DC | PRN
  Administered 2020-09-04: 50 mg via INTRAVENOUS

## 2020-09-04 MED ORDER — FENTANYL CITRATE (PF) 250 MCG/5ML IJ SOLN
INTRAMUSCULAR | Status: AC
  Filled 2020-09-04: qty 5

## 2020-09-04 MED ORDER — LACTATED RINGERS IV SOLN: INTRAVENOUS | Status: DC | PRN

## 2020-09-04 MED ORDER — ONDANSETRON HCL 4 MG/2ML IJ SOLN
INTRAMUSCULAR | Status: DC | PRN
  Administered 2020-09-04: 4 mg via INTRAVENOUS

## 2020-09-04 MED ORDER — DOCUSATE SODIUM 100 MG PO CAPS
100.0000 mg | ORAL_CAPSULE | Freq: Two times a day (BID) | ORAL | Status: DC
  Administered 2020-09-04 – 2020-09-05 (×3): 100 mg via ORAL
  Filled 2020-09-04 (×3): qty 1

## 2020-09-04 MED ORDER — POLYETHYLENE GLYCOL 3350 17 G PO PACK: 17.0000 g | PACK | Freq: Every day | ORAL | Status: DC | PRN

## 2020-09-04 MED ORDER — OXYCODONE HCL 5 MG/5ML PO SOLN
5.0000 mg | Freq: Once | ORAL | Status: DC | PRN
Start: 2020-09-04 — End: 2020-09-04

## 2020-09-04 MED ORDER — SUGAMMADEX SODIUM 200 MG/2ML IV SOLN
INTRAVENOUS | Status: DC | PRN
  Administered 2020-09-04: 200 mg via INTRAVENOUS

## 2020-09-04 MED ORDER — SODIUM CHLORIDE 0.9 % IR SOLN
Status: DC | PRN
  Administered 2020-09-04: 1000 mL

## 2020-09-04 MED ORDER — 0.9 % SODIUM CHLORIDE (POUR BTL) OPTIME
TOPICAL | Status: DC | PRN
  Administered 2020-09-04: 1000 mL

## 2020-09-04 MED ORDER — PROPOFOL 10 MG/ML IV BOLUS
INTRAVENOUS | Status: AC
  Filled 2020-09-04: qty 40

## 2020-09-04 MED ORDER — HEMOSTATIC AGENTS (NO CHARGE) OPTIME
TOPICAL | Status: DC | PRN
  Administered 2020-09-04: 1 via TOPICAL

## 2020-09-04 MED ORDER — DEXAMETHASONE SODIUM PHOSPHATE 10 MG/ML IJ SOLN
INTRAMUSCULAR | Status: DC | PRN
Start: 2020-09-04 — End: 2020-09-04
  Administered 2020-09-04: 10 mg via INTRAVENOUS

## 2020-09-04 SURGICAL SUPPLY — 45 items
APPLIER CLIP ROT 10 11.4 M/L (STAPLE)
BAG COUNTER SPONGE SURGICOUNT (BAG) ×2 IMPLANT
BLADE CLIPPER SURG (BLADE) IMPLANT
CANISTER SUCT 3000ML PPV (MISCELLANEOUS) ×2 IMPLANT
CHLORAPREP W/TINT 26 (MISCELLANEOUS) ×2 IMPLANT
CLIP APPLIE ROT 10 11.4 M/L (STAPLE) IMPLANT
CNTNR URN SCR LID CUP LEK RST (MISCELLANEOUS) ×1 IMPLANT
CONT SPEC 4OZ STRL OR WHT (MISCELLANEOUS) ×1
COVER SURGICAL LIGHT HANDLE (MISCELLANEOUS) ×2 IMPLANT
CUTTER FLEX LINEAR 45M (STAPLE) ×2 IMPLANT
DERMABOND ADVANCED (GAUZE/BANDAGES/DRESSINGS) ×1
DERMABOND ADVANCED .7 DNX12 (GAUZE/BANDAGES/DRESSINGS) ×1 IMPLANT
ELECT REM PT RETURN 9FT ADLT (ELECTROSURGICAL) ×2
ELECTRODE REM PT RTRN 9FT ADLT (ELECTROSURGICAL) ×1 IMPLANT
ENDOLOOP SUT PDS II  0 18 (SUTURE)
ENDOLOOP SUT PDS II 0 18 (SUTURE) IMPLANT
GLOVE SRG 8 PF TXTR STRL LF DI (GLOVE) ×1 IMPLANT
GLOVE SURG ENC MOIS LTX SZ8 (GLOVE) ×2 IMPLANT
GLOVE SURG UNDER POLY LF SZ8 (GLOVE) ×1
GOWN STRL REUS W/ TWL LRG LVL3 (GOWN DISPOSABLE) ×2 IMPLANT
GOWN STRL REUS W/ TWL XL LVL3 (GOWN DISPOSABLE) ×1 IMPLANT
GOWN STRL REUS W/TWL LRG LVL3 (GOWN DISPOSABLE) ×2
GOWN STRL REUS W/TWL XL LVL3 (GOWN DISPOSABLE) ×1
KIT BASIN OR (CUSTOM PROCEDURE TRAY) ×2 IMPLANT
KIT TURNOVER KIT B (KITS) ×2 IMPLANT
NS IRRIG 1000ML POUR BTL (IV SOLUTION) ×2 IMPLANT
PAD ARMBOARD 7.5X6 YLW CONV (MISCELLANEOUS) ×4 IMPLANT
POUCH RETRIEVAL ECOSAC 10 (ENDOMECHANICALS) ×1 IMPLANT
POUCH RETRIEVAL ECOSAC 10MM (ENDOMECHANICALS) ×1
RELOAD STAPLE TA45 3.5 REG BLU (ENDOMECHANICALS) ×2 IMPLANT
SCISSORS LAP 5X35 DISP (ENDOMECHANICALS) ×2 IMPLANT
SET IRRIG TUBING LAPAROSCOPIC (IRRIGATION / IRRIGATOR) ×2 IMPLANT
SET TUBE SMOKE EVAC HIGH FLOW (TUBING) ×2 IMPLANT
SHEARS HARMONIC ACE PLUS 36CM (ENDOMECHANICALS) ×2 IMPLANT
SPECIMEN JAR SMALL (MISCELLANEOUS) ×2 IMPLANT
SUT MON AB 4-0 PC3 18 (SUTURE) ×2 IMPLANT
TOWEL GREEN STERILE (TOWEL DISPOSABLE) ×2 IMPLANT
TOWEL GREEN STERILE FF (TOWEL DISPOSABLE) ×2 IMPLANT
TRAY FOLEY W/BAG SLVR 16FR (SET/KITS/TRAYS/PACK) ×1
TRAY FOLEY W/BAG SLVR 16FR ST (SET/KITS/TRAYS/PACK) ×1 IMPLANT
TRAY LAPAROSCOPIC MC (CUSTOM PROCEDURE TRAY) ×2 IMPLANT
TROCAR XCEL BLADELESS 5X75MML (TROCAR) ×4 IMPLANT
TROCAR XCEL BLUNT TIP 100MML (ENDOMECHANICALS) ×2 IMPLANT
WARMER LAPAROSCOPE (MISCELLANEOUS) ×2 IMPLANT
WATER STERILE IRR 1000ML POUR (IV SOLUTION) ×2 IMPLANT

## 2020-09-04 NOTE — Anesthesia Preprocedure Evaluation (Signed)
Anesthesia Evaluation  Patient identified by MRN, date of birth, ID band Patient awake    Reviewed: Allergy & Precautions, NPO status , Patient's Chart, lab work & pertinent test results  History of Anesthesia Complications Negative for: history of anesthetic complications  Airway Mallampati: I  TM Distance: >3 FB Neck ROM: Full    Dental no notable dental hx.    Pulmonary    Pulmonary exam normal        Cardiovascular hypertension, Pt. on medications Normal cardiovascular exam     Neuro/Psych Anxiety CVA, No Residual Symptoms    GI/Hepatic GERD  Controlled,Acute appendicitis   Endo/Other    Renal/GU      Musculoskeletal  (+) Arthritis ,   Abdominal   Peds  Hematology   Anesthesia Other Findings   Reproductive/Obstetrics                             Anesthesia Physical Anesthesia Plan  ASA: 2  Anesthesia Plan: General   Post-op Pain Management:    Induction: Intravenous  PONV Risk Score and Plan: 3 and Treatment may vary due to age or medical condition, Ondansetron and Dexamethasone  Airway Management Planned: Oral ETT  Additional Equipment: None  Intra-op Plan:   Post-operative Plan: Extubation in OR  Informed Consent: I have reviewed the patients History and Physical, chart, labs and discussed the procedure including the risks, benefits and alternatives for the proposed anesthesia with the patient or authorized representative who has indicated his/her understanding and acceptance.     Dental advisory given  Plan Discussed with: CRNA  Anesthesia Plan Comments:         Anesthesia Quick Evaluation

## 2020-09-04 NOTE — Anesthesia Postprocedure Evaluation (Signed)
Anesthesia Post Note  Patient: Chad Ford  Procedure(s) Performed: APPENDECTOMY LAPAROSCOPIC (Abdomen)     Patient location during evaluation: PACU Anesthesia Type: General Level of consciousness: awake and alert and oriented Pain management: pain level controlled Vital Signs Assessment: post-procedure vital signs reviewed and stable Respiratory status: spontaneous breathing, nonlabored ventilation and respiratory function stable Cardiovascular status: blood pressure returned to baseline Postop Assessment: no apparent nausea or vomiting Anesthetic complications: no   No notable events documented.  Last Vitals:  Vitals:   09/04/20 1100 09/04/20 1150  BP: 115/61 121/64  Pulse: (!) 59 (!) 55  Resp: 16 14  Temp: 36.7 C 36.4 C  SpO2: 94% 95%    Last Pain:  Vitals:   09/04/20 1150  TempSrc: Oral  PainSc:                  Marthenia Rolling

## 2020-09-04 NOTE — Interval H&P Note (Signed)
History and Physical Interval Note:  09/04/2020 7:54 AM  Chad Ford  has presented today for surgery, with the diagnosis of Appendicitis.  The various methods of treatment have been discussed with the patient and family. After consideration of risks, benefits and other options for treatment, the patient has consented to  Procedure(s): APPENDECTOMY LAPAROSCOPIC (N/A) as a surgical intervention.  The patient's history has been reviewed, patient examined, no change in status, stable for surgery.  I have reviewed the patient's chart and labs.  Questions were answered to the patient's satisfaction.     Pt seen examined and agree.  Discussed nonoperative and operative management and pro and cons of each.  Discussed surgical complications.  The procedure has been discussed with the patient.  Alternative therapies have been discussed with the patient.  Operative risks include bleeding,  Infection,  Organ injury,  Nerve injury,  Blood vessel injury,  DVT,  Pulmonary embolism,  Death,  And possible reoperation.  Medical management risks include worsening of present situation.  The success of the procedure is 50 -90 % at treating patients symptoms.  The patient understands and agrees to proceed.   [place]

## 2020-09-04 NOTE — Transfer of Care (Signed)
Immediate Anesthesia Transfer of Care Note  Patient: Chad Ford  Procedure(s) Performed: APPENDECTOMY LAPAROSCOPIC (Abdomen)  Patient Location: PACU  Anesthesia Type:General  Level of Consciousness: drowsy and patient cooperative  Airway & Oxygen Therapy: Patient Spontanous Breathing  Post-op Assessment: Report given to RN and Post -op Vital signs reviewed and stable  Post vital signs: Reviewed and stable  Last Vitals:  Vitals Value Taken Time  BP 123/68 09/04/20 1038  Temp    Pulse 65 09/04/20 1039  Resp 15 09/04/20 1039  SpO2 95 % 09/04/20 1039  Vitals shown include unvalidated device data.  Last Pain:  Vitals:   09/04/20 0426  TempSrc: Oral  PainSc:          Complications: No notable events documented.

## 2020-09-04 NOTE — Op Note (Signed)
Appendectomy, Laparoscopic , Procedure Note  Indications: The patient presented with a history of right-sided abdominal pain. A CT  revealed findings consistent with acute appendicitis. The procedure has been discussed with the patient.  Alternative therapies have been discussed with the patient.  Operative risks include bleeding,  Infection,  Organ injury,  Nerve injury,  Blood vessel injury,  DVT,  Pulmonary embolism,  Death,  And possible reoperation.  Medical management risks include worsening of present situation.  The success of the procedure is 50 -90 % at treating patients symptoms.  The patient understands and agrees to proceed.   Pre-operative Diagnosis: Acute appendicitis without mention of peritonitis  Post-operative Diagnosis: Acute appendicitis with generalized peritonitis  Surgeon: Turner Daniels  MD   Assistants: OR staff  Anesthesia: General endotracheal anesthesia and Local anesthesia 0.25.% bupivacaine  ASA Class: 3  Procedure Details  The patient was seen again in the Holding Room. The risks, benefits, complications, treatment options, and expected outcomes were discussed with the patient and/or family. The possibilities of reaction to medication, pulmonary aspiration, perforation of viscus, bleeding, recurrent infection, finding a normal appendix, the need for additional procedures, failure to diagnose a condition, and creating a complication requiring transfusion or operation were discussed. There was concurrence with the proposed plan and informed consent was obtained. The site of surgery was properly noted/marked. The patient was taken to Operating Room, identified as Chad Ford and the procedure verified as Appendectomy. A Time Out was held and the above information confirmed.  The patient was placed in the supine position and general anesthesia was induced, along with placement of orogastric tube, Venodyne boots, and a Foley catheter. The abdomen was prepped and  draped in a sterile fashion. A one centimeter infraumbilical incision was made and the peritoneal cavity was accessed using the OPEN  technique. The pneumoperitoneum was then established to steady pressure of 12 mmHg. A 12 mm port was placed through the umbilical incision. Additional 5 mm cannulas then placed in the left lower quadrant of the abdomen and half way between the umbilicus and pubic symphysis under direct vision. A careful evaluation of the entire abdomen was carried out. The patient was placed in Trendelenburg and left lateral decubitus position. The small intestines were retracted in the cephalad and left lateral direction away from the pelvis and right lower quadrant. The patient was found to have an enlarged and inflamed appendix that was extending into the pelvis. There was no evidence of perforation.  The appendix was  located in the gallbladder fossa and was carefully dissected. A window was made in the mesoappendix at the base of the appendix. A harmonic scalpel was used across the mesoappendix. The appendix was divided at its base using an endo-GIA stapler. Minimal appendiceal stump was left in place. There was no evidence of bleeding, leakage, or complication after division of the appendix. Irrigation was also performed and irrigate suctioned from the abdomen as well. Surgicel snow placed in the gallbladder fossa with good hemostasis.   The umbilical port site was closed using 0 vicryl pursestring sutures fashion at the level of the fascia. The trocar site skin wounds were closed using 4 O monocryl.   Instrument, sponge, and needle counts were correct at the conclusion of the case.   Findings: The appendix was found to be inflamed. There were signs of necrosis.  There was not perforation. There was not abscess formation.  Estimated Blood Loss:  less than 50 mL  Drains: NONE         Total IV Fluids: per anesthesia record          Specimens: appendix          Complications:  None; patient tolerated the procedure well.         Disposition: PACU - hemodynamically stable.         Condition: stable

## 2020-09-04 NOTE — Discharge Instructions (Addendum)
[facility] ° °Please arrive at least 30 min before your appointment to complete your check in paperwork.  If you are unable to arrive 30 min prior to your appointment time we may have to cancel or reschedule you. °LAPAROSCOPIC SURGERY: POST OP INSTRUCTIONS °Always review your discharge instruction sheet given to you by the facility where your surgery was performed. °IF YOU HAVE DISABILITY OR FAMILY LEAVE FORMS, YOU MUST BRING THEM TO THE OFFICE FOR PROCESSING.   °DO NOT GIVE THEM TO YOUR DOCTOR. ° °PAIN CONTROL ° °First take acetaminophen (Tylenol) AND/or ibuprofen (Advil) to control your pain after surgery.  Follow directions on package.  Taking acetaminophen (Tylenol) and/or ibuprofen (Advil) regularly after surgery will help to control your pain and lower the amount of prescription pain medication you may need.  You should not take more than 4,000 mg (4 grams) of acetaminophen (Tylenol) in 24 hours.  You should not take ibuprofen (Advil), aleve, motrin, naprosyn or other NSAIDS if you have a history of stomach ulcers or chronic kidney disease.  °A prescription for pain medication may be given to you upon discharge.  Take your pain medication as prescribed, if you still have uncontrolled pain after taking acetaminophen (Tylenol) or ibuprofen (Advil). °Use ice packs to help control pain. °If you need a refill on your pain medication, please contact your pharmacy.  They will contact our office to request authorization. Prescriptions will not be filled after 5pm or on week-ends. ° °HOME MEDICATIONS °Take your usually prescribed medications unless otherwise directed. ° °DIET °You should follow a light diet the first few days after arrival home.  Be sure to include lots of fluids daily. Avoid fatty, fried foods.  ° °CONSTIPATION °It is common to experience some constipation after surgery and if you are taking pain medication.  Increasing fluid intake and taking a stool softener (such as Colace)  will usually help or prevent this problem from occurring.  A mild laxative (Milk of Magnesia or Miralax) should be taken according to package instructions if there are no bowel movements after 48 hours. ° °WOUND/INCISION CARE °Most patients will experience some swelling and bruising in the area of the incisions.  Ice packs will help.  Swelling and bruising can take several days to resolve.  °Unless discharge instructions indicate otherwise, follow guidelines below  °STERI-STRIPS - you may remove your outer bandages 48 hours after surgery, and you may shower at that time.  You have steri-strips (small skin tapes) in place directly over the incision.  These strips should be left on the skin for 7-10 days.   °DERMABOND/SKIN GLUE - you may shower in 24 hours.  The glue will flake off over the next 2-3 weeks. °Any sutures or staples will be removed at the office during your follow-up visit. ° °ACTIVITIES °You may resume regular (light) daily activities beginning the next day--such as daily self-care, walking, climbing stairs--gradually increasing activities as tolerated.  You may have sexual intercourse when it is comfortable.  Refrain from any heavy lifting or straining until approved by your doctor. °You may drive when you are no longer taking prescription pain medication, you can comfortably wear a seatbelt, and you can safely maneuver your car and apply brakes. ° °FOLLOW-UP °You should see your doctor in the office for a follow-up appointment approximately 2-3 weeks after your surgery.  You should have been given your post-op/follow-up appointment when your surgery was scheduled.  If you did not receive a post-op/follow-up appointment, make sure   that you call for this appointment within a day or two after you arrive home to insure a convenient appointment time. ° ° °WHEN TO CALL YOUR DOCTOR: °Fever over 101.0 °Inability to urinate °Continued bleeding from incision. °Increased pain, redness, or drainage from the  incision. °Increasing abdominal pain ° °The clinic staff is available to answer your questions during regular business hours.  Please don’t hesitate to call and ask to speak to one of the nurses for clinical concerns.  If you have a medical emergency, go to the nearest emergency room or call 911.  A surgeon from [facility] is always on call at the hospital. °[address], [address], [place], [place]  [address] ? [address] [address], [place], [place]   [address] °[phone] ? [phone] ? FAX [phone] ° ° ° ° °Managing Your Pain After Surgery Without Opioids ° ° ° °Thank you for participating in our program to help patients manage their pain after surgery without opioids. This is part of our effort to provide you with the best care possible, without exposing you or your family to the risk that opioids pose. ° °What pain can I expect after surgery? °You can expect to have some pain after surgery. This is normal. The pain is typically worse the day after surgery, and quickly begins to get better. °Many studies have found that many patients are able to manage their pain after surgery with Over-the-Counter (OTC) medications such as Tylenol and Motrin. If you have a condition that does not allow you to take Tylenol or Motrin, notify your surgical team. ° °How will I manage my pain? °The best strategy for controlling your pain after surgery is around the clock pain control with Tylenol (acetaminophen) and Motrin (ibuprofen or Advil). Alternating these medications with each other allows you to maximize your pain control. In addition to Tylenol and Motrin, you can use heating pads or ice packs on your incisions to help reduce your pain. ° °How will I alternate your regular strength over-the-counter pain medication? °You will take a dose of pain medication every three hours. °Start by taking 650 mg of Tylenol (2 pills of 325 mg) °3 hours later take 600 mg of Motrin (3 pills of 200 mg) °3 hours after  taking the Motrin take 650 mg of Tylenol °3 hours after that take 600 mg of Motrin. ° ° °- 1 - ° °See example - if your first dose of Tylenol is at 12:00 PM ° ° °12:00 PM Tylenol 650 mg (2 pills of 325 mg)  °3:00 PM Motrin 600 mg (3 pills of 200 mg)  °6:00 PM Tylenol 650 mg (2 pills of 325 mg)  °9:00 PM Motrin 600 mg (3 pills of 200 mg)  °Continue alternating every 3 hours  ° °We recommend that you follow this schedule around-the-clock for at least 3 days after surgery, or until you feel that it is no longer needed. Use the table on the last page of this handout to keep track of the medications you are taking. °Important: °Do not take more than 3000mg of Tylenol or 3200mg of Motrin in a 24-hour period. °Do not take ibuprofen/Motrin if you have a history of bleeding stomach ulcers, severe kidney disease, &/or actively taking a blood thinner ° °What if I still have pain? °If you have pain that is not controlled with the over-the-counter pain medications (Tylenol and Motrin or Advil) you might have what we call “breakthrough” pain. You will receive a prescription   for a small amount of an opioid pain medication such as Oxycodone, Tramadol, or Tylenol with Codeine. Use these opioid pills in the first 24 hours after surgery if you have breakthrough pain. Do not take more than 1 pill every 4-6 hours. ° °If you still have uncontrolled pain after using all opioid pills, don't hesitate to call our staff using the number provided. We will help make sure you are managing your pain in the best way possible, and if necessary, we can provide a prescription for additional pain medication. ° ° °Day 1   ° °Time  °Name of Medication Number of pills taken  °Amount of Acetaminophen  °Pain Level  ° °Comments  °AM PM       °AM PM       °AM PM       °AM PM       °AM PM       °AM PM       °AM PM       °AM PM       °Total Daily amount of Acetaminophen °Do not take more than  3,000 mg per day    ° ° °Day 2   ° °Time  °Name of Medication  Number of pills °taken  °Amount of Acetaminophen  °Pain Level  ° °Comments  °AM PM       °AM PM       °AM PM       °AM PM       °AM PM       °AM PM       °AM PM       °AM PM       °Total Daily amount of Acetaminophen °Do not take more than  3,000 mg per day    ° ° °Day 3   ° °Time  °Name of Medication Number of pills taken  °Amount of Acetaminophen  °Pain Level  ° °Comments  °AM PM       °AM PM       °AM PM       °AM PM       ° ° ° °AM PM       °AM PM       °AM PM       °AM PM       °Total Daily amount of Acetaminophen °Do not take more than  3,000 mg per day    ° ° °Day 4   ° °Time  °Name of Medication Number of pills taken  °Amount of Acetaminophen  °Pain Level  ° °Comments  °AM PM       °AM PM       °AM PM       °AM PM       °AM PM       °AM PM       °AM PM       °AM PM       °Total Daily amount of Acetaminophen °Do not take more than  3,000 mg per day    ° ° °Day 5   ° °Time  °Name of Medication Number °of pills taken  °Amount of Acetaminophen  °Pain Level  ° °Comments  °AM PM       °AM PM       °AM PM       °AM PM       °AM PM       °AM   PM       °AM PM       °AM PM       °Total Daily amount of Acetaminophen °Do not take more than  3,000 mg per day    ° ° ° °Day 6   ° °Time  °Name of Medication Number of pills °taken  °Amount of Acetaminophen  °Pain Level  °Comments  °AM PM       °AM PM       °AM PM       °AM PM       °AM PM       °AM PM       °AM PM       °AM PM       °Total Daily amount of Acetaminophen °Do not take more than  3,000 mg per day    ° ° °Day 7   ° °Time  °Name of Medication Number of pills taken  °Amount of Acetaminophen  °Pain Level  ° °Comments  °AM PM       °AM PM       °AM PM       °AM PM       °AM PM       °AM PM       °AM PM       °AM PM       °Total Daily amount of Acetaminophen °Do not take more than  3,000 mg per day    ° ° ° ° °For additional information about how and where to safely dispose of unused opioid °medications - https://www.morepowerfulnc.org ° °Disclaimer: This document  contains information and/or instructional materials adapted from [facility] for the typical patient with your condition. It does not replace medical advice from your health care provider because your experience may differ from that of the °typical patient. Talk to your health care provider if you have any questions about this °document, your condition or your treatment plan. °Adapted from [facility] ° °

## 2020-09-04 NOTE — Anesthesia Procedure Notes (Signed)
Procedure Name: Intubation Date/Time: 09/04/2020 9:10 AM Performed by: Thelma Comp, CRNA Pre-anesthesia Checklist: Patient identified, Emergency Drugs available, Suction available and Patient being monitored Patient Re-evaluated:Patient Re-evaluated prior to induction Oxygen Delivery Method: Circle System Utilized Preoxygenation: Pre-oxygenation with 100% oxygen Induction Type: IV induction Ventilation: Mask ventilation without difficulty Laryngoscope Size: Mac and 4 Grade View: Grade I Tube type: Oral Number of attempts: 1 Airway Equipment and Method: Stylet Placement Confirmation: ETT inserted through vocal cords under direct vision, positive ETCO2 and breath sounds checked- equal and bilateral Secured at: 21 cm Tube secured with: Tape Dental Injury: Teeth and Oropharynx as per pre-operative assessment

## 2020-09-05 ENCOUNTER — Encounter (HOSPITAL_COMMUNITY): Payer: Self-pay | Admitting: Surgery

## 2020-09-05 MED ORDER — METRONIDAZOLE 500 MG PO TABS
500.0000 mg | ORAL_TABLET | Freq: Three times a day (TID) | ORAL | Status: DC
  Administered 2020-09-05: 500 mg via ORAL
  Filled 2020-09-05: qty 1

## 2020-09-05 MED ORDER — PANTOPRAZOLE SODIUM 40 MG PO TBEC: 40.0000 mg | DELAYED_RELEASE_TABLET | Freq: Every day | ORAL | Status: DC

## 2020-09-05 MED ORDER — METRONIDAZOLE 500 MG PO TABS: 500.0000 mg | ORAL_TABLET | Freq: Three times a day (TID) | ORAL | 0 refills | Status: AC

## 2020-09-05 MED ORDER — CIPROFLOXACIN HCL 500 MG PO TABS: 500.0000 mg | ORAL_TABLET | Freq: Two times a day (BID) | ORAL | Status: DC

## 2020-09-05 MED ORDER — CIPROFLOXACIN HCL 500 MG PO TABS: 500.0000 mg | ORAL_TABLET | Freq: Two times a day (BID) | ORAL | 0 refills | Status: AC

## 2020-09-05 MED ORDER — ACETAMINOPHEN 500 MG PO TABS: 1000.0000 mg | ORAL_TABLET | Freq: Four times a day (QID) | ORAL | Status: DC | PRN

## 2020-09-05 NOTE — Discharge Summary (Signed)
Patient ID: Chad Ford [id number] [id number] y.o.  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  It kept him up throughout the night last night.  No associated nausea.  He has had decreased appetite.  He has passed some gas and he now describes the pain is intermittent but shooting along his right side.  He came to the emergency department for further evaluation.  Work-up shows white blood cell count 12,300.  CT scan of the abdomen and pelvis is consistent with acute appendicitis.  I was asked to see him for surgical management.  Procedures Lap appy, Dr. Brantley Stage 09/04/20  Hospital Course:  The patient was admitted and underwent a laparoscopic appendectomy with some peritonitis and possible rupture of the tip.  The patient tolerated the procedure well.  On POD 1, the patient was tolerating a regular diet, voiding well, mobilizing, and pain was controlled with oral pain medications.  The patient was stable for DC home at this time with appropriate follow up made.  He will go home with a total of 5 days of abx therapy.   Physical Exam: Abd: soft, essentially NT, ND, incisions c/d/I with dermabond  Allergies as of 09/05/2020       Reactions   Penicillins Hives, Swelling   SWELLING REACTION UNSPECIFIED, FROM CHILDHOOD. Has patient had a PCN reaction causing immediate rash, facial/tongue/throat swelling, SOB or lightheadedness with hypotension: Unknown Has patient had a PCN reaction causing severe rash involving mucus membranes or skin necrosis: No Has patient had a PCN reaction that required hospitalization: No Has patient had a PCN reaction occurring within  the last 10 years: No If all of the above answers are "NO", then may proceed with Cephalosporin use.        Medication List     TAKE these medications    acetaminophen 500 MG tablet Commonly known as: TYLENOL Take 2 tablets (1,000 mg total) by mouth every 6 (six) hours as needed for mild pain (or temp > 100).   amLODipine 5 MG tablet Commonly known as: NORVASC Take 5 mg by mouth daily.   benazepril 40 MG tablet Commonly known as: LOTENSIN Take 40 mg by mouth at bedtime.   ciclopirox 0.77 % cream Commonly known as: LOPROX Apply 1 application topically See admin instructions. Apply to feet every other nite   ciprofloxacin 500 MG tablet Commonly known as: CIPRO Take 1 tablet (500 mg total) by mouth 2 (two) times daily for 4 days.   doxazosin 8 MG tablet Commonly known as: CARDURA Take 8 mg by mouth at bedtime.   metroNIDAZOLE 500 MG tablet Commonly known as: FLAGYL Take 1 tablet (500 mg total) by mouth every 8 (eight) hours for 4 days.   naproxen sodium 220 MG tablet Commonly known as: ALEVE Take 220 mg by mouth 2 (two) times daily as needed (pain/headache/fever).   pravastatin 40 MG tablet Commonly known as: PRAVACHOL Take 40 mg by mouth at bedtime.          Follow-up Information     [facility]. Call.   Specialty: General Surgery Why: Please call to confirm your appointment time. We are working hard to make this for you. Please bring a copy of  your photo ID and insurance card. Please arrive 30 minutes prior to your appointment for paperwork. Contact information: [place] [address] [phone]                 Signed: Saverio Danker, [facility] 09/05/2020, 9:43 AM Please see Amion for pager number during day hours 7:00am-4:30pm, 7-11:30am on Weekends

## 2020-09-05 NOTE — Progress Notes (Signed)
Discharge instructions given to pt. Pt verbalized understanding of all teaching. Pt requested to be given next scheduled Flagyl abx PO before leaving. Given now. Pt wife has arrived now to take him home.

## 2020-09-06 LAB — SURGICAL PATHOLOGY

## 2020-09-07 ENCOUNTER — Encounter: Payer: Self-pay | Admitting: Surgery

## 2020-09-11 DIAGNOSIS — H903 Sensorineural hearing loss, bilateral: Secondary | ICD-10-CM | POA: Diagnosis not present

## 2020-09-27 ENCOUNTER — Other Ambulatory Visit: Payer: Self-pay | Admitting: Student

## 2020-09-27 DIAGNOSIS — C181 Malignant neoplasm of appendix: Secondary | ICD-10-CM

## 2020-10-02 ENCOUNTER — Ambulatory Visit
Admission: RE | Admit: 2020-10-02 | Discharge: 2020-10-02 | Disposition: A | Discharge status: Home / Self Care | Payer: PPO | Source: Ambulatory Visit | Attending: Student | Admitting: Student

## 2020-10-02 DIAGNOSIS — C181 Malignant neoplasm of appendix: Secondary | ICD-10-CM

## 2020-10-02 DIAGNOSIS — J8410 Pulmonary fibrosis, unspecified: Secondary | ICD-10-CM | POA: Diagnosis not present

## 2020-10-02 DIAGNOSIS — K449 Diaphragmatic hernia without obstruction or gangrene: Secondary | ICD-10-CM | POA: Diagnosis not present

## 2020-10-02 MED ORDER — IOPAMIDOL (ISOVUE-300) INJECTION 61%
75.0000 mL | Freq: Once | INTRAVENOUS | Status: AC | PRN
  Administered 2020-10-02: 75 mL via INTRAVENOUS

## 2020-10-08 DIAGNOSIS — I10 Essential (primary) hypertension: Secondary | ICD-10-CM | POA: Diagnosis not present

## 2020-10-08 DIAGNOSIS — K862 Cyst of pancreas: Secondary | ICD-10-CM | POA: Diagnosis not present

## 2020-10-08 DIAGNOSIS — C181 Malignant neoplasm of appendix: Secondary | ICD-10-CM | POA: Diagnosis not present

## 2020-10-30 DIAGNOSIS — K5730 Diverticulosis of large intestine without perforation or abscess without bleeding: Secondary | ICD-10-CM | POA: Diagnosis not present

## 2020-10-30 DIAGNOSIS — C181 Malignant neoplasm of appendix: Secondary | ICD-10-CM | POA: Diagnosis not present

## 2020-11-01 ENCOUNTER — Telehealth: Payer: Self-pay

## 2020-11-01 MED ORDER — CICLOPIROX OLAMINE 0.77 % EX CREA: 1.0000 "application " | TOPICAL_CREAM | CUTANEOUS | 2 refills | Status: AC

## 2020-11-01 NOTE — Telephone Encounter (Signed)
Ok to refill 

## 2020-12-18 DIAGNOSIS — Z0000 Encounter for general adult medical examination without abnormal findings: Secondary | ICD-10-CM | POA: Diagnosis not present

## 2020-12-18 DIAGNOSIS — N528 Other male erectile dysfunction: Secondary | ICD-10-CM | POA: Diagnosis not present

## 2020-12-18 DIAGNOSIS — R739 Hyperglycemia, unspecified: Secondary | ICD-10-CM | POA: Diagnosis not present

## 2020-12-18 DIAGNOSIS — I10 Essential (primary) hypertension: Secondary | ICD-10-CM | POA: Diagnosis not present

## 2020-12-18 DIAGNOSIS — E7800 Pure hypercholesterolemia, unspecified: Secondary | ICD-10-CM | POA: Diagnosis not present

## 2020-12-18 DIAGNOSIS — N400 Enlarged prostate without lower urinary tract symptoms: Secondary | ICD-10-CM | POA: Diagnosis not present

## 2020-12-19 ENCOUNTER — Encounter: Payer: Self-pay | Admitting: Dermatology

## 2020-12-19 ENCOUNTER — Ambulatory Visit (INDEPENDENT_AMBULATORY_CARE_PROVIDER_SITE_OTHER): Payer: PPO | Admitting: Dermatology

## 2020-12-19 ENCOUNTER — Other Ambulatory Visit: Payer: Self-pay

## 2020-12-19 DIAGNOSIS — C181 Malignant neoplasm of appendix: Secondary | ICD-10-CM | POA: Diagnosis not present

## 2020-12-19 DIAGNOSIS — L570 Actinic keratosis: Secondary | ICD-10-CM | POA: Diagnosis not present

## 2020-12-19 DIAGNOSIS — D044 Carcinoma in situ of skin of scalp and neck: Secondary | ICD-10-CM

## 2020-12-19 DIAGNOSIS — Z1283 Encounter for screening for malignant neoplasm of skin: Secondary | ICD-10-CM | POA: Diagnosis not present

## 2020-12-19 DIAGNOSIS — D0439 Carcinoma in situ of skin of other parts of face: Secondary | ICD-10-CM | POA: Diagnosis not present

## 2020-12-19 DIAGNOSIS — L820 Inflamed seborrheic keratosis: Secondary | ICD-10-CM | POA: Diagnosis not present

## 2020-12-19 DIAGNOSIS — D485 Neoplasm of uncertain behavior of skin: Secondary | ICD-10-CM

## 2020-12-19 NOTE — Patient Instructions (Signed)

## 2020-12-20 ENCOUNTER — Other Ambulatory Visit: Payer: Self-pay | Admitting: Surgery

## 2020-12-20 ENCOUNTER — Other Ambulatory Visit (HOSPITAL_COMMUNITY): Payer: Self-pay | Admitting: Surgery

## 2020-12-20 DIAGNOSIS — C181 Malignant neoplasm of appendix: Secondary | ICD-10-CM

## 2020-12-21 ENCOUNTER — Other Ambulatory Visit (HOSPITAL_COMMUNITY): Payer: Self-pay | Admitting: Surgery

## 2020-12-21 ENCOUNTER — Ambulatory Visit (HOSPITAL_COMMUNITY)
Admission: RE | Admit: 2020-12-21 | Discharge: 2020-12-21 | Disposition: A | Discharge status: Home / Self Care | Payer: PPO | Source: Ambulatory Visit | Attending: Surgery | Admitting: Surgery

## 2020-12-21 DIAGNOSIS — C181 Malignant neoplasm of appendix: Secondary | ICD-10-CM

## 2020-12-21 DIAGNOSIS — N1330 Unspecified hydronephrosis: Secondary | ICD-10-CM | POA: Diagnosis not present

## 2020-12-21 DIAGNOSIS — I440 Atrioventricular block, first degree: Secondary | ICD-10-CM | POA: Diagnosis not present

## 2020-12-21 DIAGNOSIS — I444 Left anterior fascicular block: Secondary | ICD-10-CM | POA: Diagnosis not present

## 2020-12-21 DIAGNOSIS — K7689 Other specified diseases of liver: Secondary | ICD-10-CM | POA: Diagnosis not present

## 2020-12-21 DIAGNOSIS — I700 Atherosclerosis of aorta: Secondary | ICD-10-CM | POA: Diagnosis not present

## 2020-12-21 DIAGNOSIS — K862 Cyst of pancreas: Secondary | ICD-10-CM | POA: Diagnosis not present

## 2020-12-21 MED ORDER — GADOBUTROL 1 MMOL/ML IV SOLN
7.0000 mL | Freq: Once | INTRAVENOUS | Status: AC | PRN
  Administered 2020-12-21: 7 mL via INTRAVENOUS

## 2020-12-25 ENCOUNTER — Telehealth: Payer: Self-pay | Admitting: *Deleted

## 2020-12-25 DIAGNOSIS — I444 Left anterior fascicular block: Secondary | ICD-10-CM | POA: Diagnosis not present

## 2020-12-25 DIAGNOSIS — I440 Atrioventricular block, first degree: Secondary | ICD-10-CM | POA: Diagnosis not present

## 2020-12-25 NOTE — Telephone Encounter (Signed)
Pathology to patient-surgery appointment scheduled.  

## 2020-12-25 NOTE — Telephone Encounter (Signed)
-----   Message from Lavonna Monarch, MD sent at 12/25/2020  5:45 AM EST ----- The back was treated at the time of the biopsy.  The temple and one of the scalp lesions should be scheduled as a routine surgery with me for curettage/fluorouracil.

## 2021-01-07 ENCOUNTER — Encounter: Payer: Self-pay | Admitting: Dermatology

## 2021-01-07 NOTE — Progress Notes (Signed)
Follow-Up Visit   Subjective  Chad Ford is a 80 y.o. male who presents for the following: Annual Exam (Patient here today for yearly skin check. Per patient he has a lesion in front of his left ear x years no bleeding, no pain. Per patient he has a small rough patch on his left ankle x 6 months no bleeding. ).  Annual examination, several new crusts on back and face and scalp. Location:  Duration:  Quality:  Associated Signs/Symptoms: Modifying Factors:  Severity:  Timing: Context:   Objective  Well appearing patient in no apparent distress; mood and affect are within normal limits. Waist up skin examination, no atypical pigmented lesions.  1 possible nonmelanoma skin cancers will be biopsied.  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  Left Temple Pearly 5 mm papule with tiny central erosion rule out carcinoma       Mid Parietal Scalp 1.3 cm waxy pink crust, rule out CIS       Right Mid Parietal Scalp 1.4 cm waxy crust, rule out CIS       Left Lower Outer Back Inflamed 8 mm crust, carcinoma versus I SK       Mid Forehead, Mid Parietal Scalp Multiple very small gritty pink crusts, 2 more hypertrophic lesions will be treated with LN2       All skin waist up examined.   Assessment & Plan    Neoplasm of uncertain behavior of skin (4) Left Temple  Skin / nail biopsy Type of biopsy: tangential   Informed consent: discussed and consent obtained   Timeout: patient name, date of birth, surgical site, and procedure verified   Procedure prep:  Patient was prepped and draped in usual sterile fashion (Non sterile) Prep type:  Chlorhexidine Anesthesia: the lesion was anesthetized in a standard fashion   Anesthetic:  1% lidocaine w/ epinephrine 1-100,000 local infiltration Instrument used: flexible razor blade   Outcome: patient tolerated procedure well   Post-procedure details: wound care instructions given    Specimen 1 -  Surgical pathology Differential Diagnosis: R/O BCC vs SCC  Check Margins: No  Mid Parietal Scalp  Skin / nail biopsy Type of biopsy: tangential   Informed consent: discussed and consent obtained   Timeout: patient name, date of birth, surgical site, and procedure verified   Procedure prep:  Patient was prepped and draped in usual sterile fashion (Non sterile) Prep type:  Chlorhexidine Anesthesia: the lesion was anesthetized in a standard fashion   Anesthetic:  1% lidocaine w/ epinephrine 1-100,000 local infiltration Instrument used: flexible razor blade   Outcome: patient tolerated procedure well   Post-procedure details: wound care instructions given    Specimen 2 - Surgical pathology Differential Diagnosis: R/O BCC vs SCC  Check Margins: No  Right Mid Parietal Scalp  Skin / nail biopsy Type of biopsy: tangential   Informed consent: discussed and consent obtained   Timeout: patient name, date of birth, surgical site, and procedure verified   Procedure prep:  Patient was prepped and draped in usual sterile fashion (Non sterile) Prep type:  Chlorhexidine Anesthesia: the lesion was anesthetized in a standard fashion   Anesthetic:  1% lidocaine w/ epinephrine 1-100,000 local infiltration Instrument used: flexible razor blade   Outcome: patient tolerated procedure well   Post-procedure details: wound care instructions given    Specimen 3 - Surgical pathology Differential Diagnosis: R/O BCC vs SCC  Check Margins: No  Left Lower Outer Back  Skin / nail biopsy Type of  biopsy: tangential   Informed consent: discussed and consent obtained   Timeout: patient name, date of birth, surgical site, and procedure verified   Procedure prep:  Patient was prepped and draped in usual sterile fashion (Non sterile) Prep type:  Chlorhexidine Anesthesia: the lesion was anesthetized in a standard fashion   Anesthetic:  1% lidocaine w/ epinephrine 1-100,000 local infiltration Instrument  used: flexible razor blade   Outcome: patient tolerated procedure well   Post-procedure details: wound care instructions given    Destruction of lesion Complexity: simple   Destruction method: electrodesiccation and curettage   Informed consent: discussed and consent obtained   Timeout:  patient name, date of birth, surgical site, and procedure verified Anesthesia: the lesion was anesthetized in a standard fashion   Anesthetic:  1% lidocaine w/ epinephrine 1-100,000 local infiltration Curettage performed in three different directions: Yes   Electrodesiccation performed over the curetted area: Yes   Curettage cycles:  1 Margin per side (cm):  0.1 Hemostasis achieved with:  ferric subsulfate and electrodesiccation Outcome: patient tolerated procedure well with no complications   Post-procedure details: sterile dressing applied and wound care instructions given   Dressing type: bandage and petrolatum    Specimen 4 - Surgical pathology Differential Diagnosis: R/O BCC vs SCC - treated after biopsy  Check Margins: No  AK (actinic keratosis) (2) Mid Parietal Scalp; Mid Forehead  Destruction of lesion - Mid Forehead, Mid Parietal Scalp Complexity: simple   Destruction method: cryotherapy   Informed consent: discussed and consent obtained   Lesion destroyed using liquid nitrogen: Yes   Cryotherapy cycles:  3 Outcome: patient tolerated procedure well with no complications    Encounter for screening for malignant neoplasm of skin  Annual skin examination.      I, Lavonna Monarch, MD, have reviewed all documentation for this visit.  The documentation on 01/07/21 for the exam, diagnosis, procedures, and orders are all accurate and complete.

## 2021-01-10 NOTE — Progress Notes (Signed)
Surgery orders requested with Dr. Orest Dikes office.

## 2021-01-16 ENCOUNTER — Ambulatory Visit: Payer: Self-pay | Admitting: Surgery

## 2021-01-16 DIAGNOSIS — Z01818 Encounter for other preprocedural examination: Secondary | ICD-10-CM

## 2021-01-24 NOTE — Patient Instructions (Signed)
DUE TO COVID-19 ONLY ONE VISITOR IS ALLOWED TO COME WITH YOU AND STAY IN THE WAITING ROOM ONLY DURING PRE OP AND PROCEDURE DAY OF SURGERY IF YOU ARE GOING HOME AFTER SURGERY. IF YOU ARE SPENDING THE NIGHT 2 PEOPLE MAY VISIT WITH YOU IN YOUR PRIVATE ROOM AFTER SURGERY UNTIL VISITING  HOURS ARE OVER AT 8:00 PM AND 1  VISITOR  MAY  SPEND THE NIGHT.   YOU NEED TO HAVE A COVID 19 TEST ON__1/17/23_____ @__9 :30 am_____, THIS TEST MUST BE DONE BEFORE SURGERY,                 Chad Ford     Your procedure is scheduled on: 01/31/21   Report to [facility] Main  Entrance   Report to admitting at   11:45 AM     Call this number if you have problems the morning of surgery [phone]   Follow all instructions from the Dr.'s office for diet and bowel prep.  Drink plenty of fluids on the day of prep to prevent dehydration   DRINK 2 PRESURGERY ENSURE DRINKS THE NIGHT BEFORE SURGERY AT 10:00 PM .   NO SOLIDS AFTER MIDNIGHT THE DAY PRIOR TO THE SURGERY.   NOTHING BY MOUTH EXCEPT CLEAR LIQUIDS UNTIL 11:00 am  THREE HOURS PRIOR TO SCHEDULED SURGERY. PLEASE FINISH PRESURGERY ENSURE DRINK PER SURGEON ORDER 3 HOURS PRIOR TO SCHEDULED SURGERY TIME WHICH NEEDS TO BE COMPLETED AT _11:00 am________.   CLEAR LIQUID DIET   Foods Allowed                                                                     Foods Excluded  Coffee and tea, regular and decaf                             liquids that you cannot  Plain Jell-O any favor except red or purple                                           see through such as: Fruit ices (not with fruit pulp)                                     milk, soups, orange juice  Iced Popsicles                                    All solid food Carbonated beverages, regular and diet                                    Cranberry, grape and apple juices Sports drinks like Gatorade Lightly seasoned clear broth or consume(fat free) Sugar    BRUSH YOUR TEETH MORNING  OF SURGERY AND RINSE YOUR MOUTH OUT, NO CHEWING GUM CANDY OR MINTS.     Take these medicines the morning of surgery with  A SIP OF WATER: Amlodipine                                You may not have any metal on your body including              piercings  Do not wear jewelry, lotions, powders or deodorant             Men may shave face and neck.   Do not bring valuables to the hospital. [place].  Contacts, dentures or bridgework may not be worn into surgery.  Leave suitcase in the car. After surgery it may be brought to your room.                  Please read over the following fact sheets you were given: _____________________________________________________________________             [facility] - Preparing for Surgery Before surgery, you can play an important role.  Because skin is not sterile, your skin needs to be as free of germs as possible.  You can reduce the number of germs on your skin by washing with CHG (chlorahexidine gluconate) soap before surgery.  CHG is an antiseptic cleaner which kills germs and bonds with the skin to continue killing germs even after washing. Please DO NOT use if you have an allergy to CHG or antibacterial soaps.  If your skin becomes reddened/irritated stop using the CHG and inform your nurse when you arrive at Short Stay.  You may shave your face/neck. Please follow these instructions carefully:  1.  Shower with CHG Soap the night before surgery and the  morning of Surgery.  2.  If you choose to wash your hair, wash your hair first as usual with your  normal  shampoo.  3.  After you shampoo, rinse your hair and body thoroughly to remove the  shampoo.                            4.  Use CHG as you would any other liquid soap.  You can apply chg directly  to the skin and wash                       Gently with a scrungie or clean washcloth.  5.  Apply the CHG Soap to your body ONLY FROM THE NECK DOWN.    Do not use on face/ open                           Wound or open sores. Avoid contact with eyes, ears mouth and genitals (private parts).                       Wash face,  Genitals (private parts) with your normal soap.             6.  Wash thoroughly, paying special attention to the area where your surgery  will be performed.  7.  Thoroughly rinse your body with warm water from the neck down.  8.  DO NOT shower/wash with your normal soap after using and rinsing off  the CHG Soap.  9.  Pat yourself dry with a clean towel.            10.  Wear clean pajamas.            11.  Place clean sheets on your bed the night of your first shower and do not  sleep with pets. Day of Surgery : Do not apply any lotions/deodorants the morning of surgery.  Please wear clean clothes to the hospital/surgery center.  FAILURE TO FOLLOW THESE INSTRUCTIONS MAY RESULT IN THE CANCELLATION OF YOUR SURGERY PATIENT SIGNATURE_________________________________  NURSE SIGNATURE__________________________________  ________________________________________________________________________   Adam Phenix  An incentive spirometer is a tool that can help keep your lungs clear and active. This tool measures how well you are filling your lungs with each breath. Taking long deep breaths may help reverse or decrease the chance of developing breathing (pulmonary) problems (especially infection) following: A long period of time when you are unable to move or be active. BEFORE THE PROCEDURE  If the spirometer includes an indicator to show your best effort, your nurse or respiratory therapist will set it to a desired goal. If possible, sit up straight or lean slightly forward. Try not to slouch. Hold the incentive spirometer in an upright position. INSTRUCTIONS FOR USE  Sit on the edge of your bed if possible, or sit up as far as you can in bed or on a chair. Hold the incentive spirometer in an upright  position. Breathe out normally. Place the mouthpiece in your mouth and seal your lips tightly around it. Breathe in slowly and as deeply as possible, raising the piston or the ball toward the top of the column. Hold your breath for 3-5 seconds or for as long as possible. Allow the piston or ball to fall to the bottom of the column. Remove the mouthpiece from your mouth and breathe out normally. Rest for a few seconds and repeat Steps 1 through 7 at least 10 times every 1-2 hours when you are awake. Take your time and take a few normal breaths between deep breaths. The spirometer may include an indicator to show your best effort. Use the indicator as a goal to work toward during each repetition. After each set of 10 deep breaths, practice coughing to be sure your lungs are clear. If you have an incision (the cut made at the time of surgery), support your incision when coughing by placing a pillow or rolled up towels firmly against it. Once you are able to get out of bed, walk around indoors and cough well. You may stop using the incentive spirometer when instructed by your caregiver.  RISKS AND COMPLICATIONS Take your time so you do not get dizzy or light-headed. If you are in pain, you may need to take or ask for pain medication before doing incentive spirometry. It is harder to take a deep breath if you are having pain. AFTER USE Rest and breathe slowly and easily. It can be helpful to keep track of a log of your progress. Your caregiver can provide you with a simple table to help with this. If you are using the spirometer at home, follow these instructions: [place] IF:  You are having difficultly using the spirometer. You have trouble using the spirometer as often as instructed. Your pain medication is not giving enough relief while using the spirometer. You develop fever of 100.5 F (38.1 C) or higher. SEEK IMMEDIATE MEDICAL CARE IF:  You cough up bloody sputum that had not  been  present before. You develop fever of 102 F (38.9 C) or greater. You develop worsening pain at or near the incision site. MAKE SURE YOU:  Understand these instructions. Will watch your condition. Will get help right away if you are not doing well or get worse. Document Released: 05/12/2006 Document Revised: 03/24/2011 Document Reviewed: 07/13/2006 [facility] Patient Information 2014 [place], [place].   ________________________________________________________________________

## 2021-01-25 ENCOUNTER — Encounter (HOSPITAL_COMMUNITY): Payer: Self-pay

## 2021-01-25 ENCOUNTER — Other Ambulatory Visit: Payer: Self-pay

## 2021-01-25 ENCOUNTER — Encounter (HOSPITAL_COMMUNITY)
Admission: RE | Admit: 2021-01-25 | Discharge: 2021-01-25 | Disposition: A | Discharge status: Home / Self Care | Payer: PPO | Source: Ambulatory Visit | Attending: Urology | Admitting: Urology

## 2021-01-25 VITALS — BP 137/73 | HR 76 | Temp 98.1°F | Resp 18 | Ht 68.5 in | Wt 157.0 lb

## 2021-01-25 DIAGNOSIS — Z01812 Encounter for preprocedural laboratory examination: Secondary | ICD-10-CM | POA: Insufficient documentation

## 2021-01-25 DIAGNOSIS — Z01818 Encounter for other preprocedural examination: Secondary | ICD-10-CM

## 2021-01-25 LAB — CBC
HCT: 42.9 % (ref 39.0–52.0)
Hemoglobin: 14.4 g/dL (ref 13.0–17.0)
MCH: 31.8 pg (ref 26.0–34.0)
MCHC: 33.6 g/dL (ref 30.0–36.0)
MCV: 94.7 fL (ref 80.0–100.0)
Platelets: 154 10*3/uL (ref 150–400)
RBC: 4.53 MIL/uL (ref 4.22–5.81)
RDW: 12.9 % (ref 11.5–15.5)
WBC: 3.5 10*3/uL — ABNORMAL LOW (ref 4.0–10.5)
nRBC: 0 % (ref 0.0–0.2)

## 2021-01-25 LAB — BASIC METABOLIC PANEL
Anion gap: 7 (ref 5–15)
BUN: 17 mg/dL (ref 8–23)
CO2: 25 mmol/L (ref 22–32)
Calcium: 9 mg/dL (ref 8.9–10.3)
Chloride: 105 mmol/L (ref 98–111)
Creatinine, Ser: 0.73 mg/dL (ref 0.61–1.24)
GFR, Estimated: >60 mL/min (ref >60)
Glucose, Bld: 121 mg/dL — ABNORMAL HIGH (ref 70–99)
Potassium: 3.9 mmol/L (ref 3.5–5.1)
Sodium: 137 mmol/L (ref 135–145)

## 2021-01-25 LAB — HEMOGLOBIN A1C
Hgb A1c MFr Bld: 5.7 % — ABNORMAL HIGH (ref 4.8–5.6)
Mean Plasma Glucose: 116.89 mg/dL

## 2021-01-25 NOTE — Progress Notes (Addendum)
COVID test- 01/29/21 at 9:30 am Date COVID Vaccine completed: COVID vaccine manufacturer: [place]   PCP - Bing Matter Cardiologist - none  Chest x-ray - 10/02/20-epic EKG - 09/03/20-epic Stress Test - no ECHO - no Cardiac Cath - no Pacemaker/ICD device last checked:NA  Sleep Study - no CPAP -   Fasting Blood Sugar - NA Checks Blood Sugar _____ times a day  Blood Thinner Instructions:NA Aspirin Instructions: Last Dose:  Anesthesia review: yes  Patient denies shortness of breath, fever, cough and chest pain at PAT appointment Pt reports no SOB with activity. He had multiple skin ca and moles removed In Oct he has one biopsy done that hasn't healed completely. It is 1.5 cm,purple, flat, non-draining spot.He has an appointment with the dermatologist in the next 30 days .  Patient verbalized understanding of instructions that were given to them at the PAT appointment. Patient was also instructed that they will need to review over the PAT instructions again at home before surgery. yes

## 2021-01-29 ENCOUNTER — Encounter (HOSPITAL_COMMUNITY)
Admission: RE | Admit: 2021-01-29 | Discharge: 2021-01-29 | Disposition: A | Discharge status: Home / Self Care | Payer: PPO | Source: Ambulatory Visit | Attending: Surgery | Admitting: Surgery

## 2021-01-29 ENCOUNTER — Other Ambulatory Visit: Payer: Self-pay

## 2021-01-29 DIAGNOSIS — Z20822 Contact with and (suspected) exposure to covid-19: Secondary | ICD-10-CM | POA: Insufficient documentation

## 2021-01-29 DIAGNOSIS — Z01812 Encounter for preprocedural laboratory examination: Secondary | ICD-10-CM | POA: Insufficient documentation

## 2021-01-29 DIAGNOSIS — Z01818 Encounter for other preprocedural examination: Secondary | ICD-10-CM

## 2021-01-29 LAB — SARS CORONAVIRUS 2 (TAT 6-24 HRS): SARS Coronavirus 2: NEGATIVE

## 2021-01-30 NOTE — Anesthesia Preprocedure Evaluation (Addendum)
Anesthesia Evaluation  Patient identified by MRN, date of birth, ID band Patient awake    Reviewed: Allergy & Precautions, NPO status , Patient's Chart, lab work & pertinent test results  Airway Mallampati: II  TM Distance: >3 FB Neck ROM: Full    Dental no notable dental hx. (+) Teeth Intact, Dental Advisory Given   Pulmonary    Pulmonary exam normal breath sounds clear to auscultation       Cardiovascular hypertension, Pt. on medications Normal cardiovascular exam Rhythm:Regular Rate:Normal     Neuro/Psych Anxiety TIA   GI/Hepatic GERD  ,  Endo/Other  negative endocrine ROS  Renal/GU Lab Results      Component                Value               Date                      CREATININE               0.73                01/25/2021                NA                       137                 01/25/2021                K                        3.9                 01/25/2021                    Musculoskeletal  (+) Arthritis ,   Abdominal   Peds  Hematology Lab Results      Component                Value               Date                            HGB                      14.4                01/25/2021                HCT                      42.9                01/25/2021                PLT                      154                 01/25/2021              Anesthesia Other Findings All: PCN  Reproductive/Obstetrics  Anesthesia Physical Anesthesia Plan  ASA: 2  Anesthesia Plan: General   Post-op Pain Management: Lidocaine infusion and Ofirmev IV (intra-op)   Induction: Intravenous  PONV Risk Score and Plan: 3 and Treatment may vary due to age or medical condition and Ondansetron  Airway Management Planned: Oral ETT  Additional Equipment: None  Intra-op Plan:   Post-operative Plan: Extubation in OR  Informed Consent: I have reviewed the patients History and  Physical, chart, labs and discussed the procedure including the risks, benefits and alternatives for the proposed anesthesia with the patient or authorized representative who has indicated his/her understanding and acceptance.     Dental advisory given  Plan Discussed with: CRNA and Anesthesiologist  Anesthesia Plan Comments: (GA + lidocaine)       Anesthesia Quick Evaluation

## 2021-01-31 ENCOUNTER — Encounter (HOSPITAL_COMMUNITY)
Admission: RE | Disposition: A | Discharge status: Home / Self Care | Payer: Self-pay | Source: Ambulatory Visit | Attending: Surgery

## 2021-01-31 ENCOUNTER — Inpatient Hospital Stay (HOSPITAL_COMMUNITY): Payer: PPO | Admitting: Anesthesiology

## 2021-01-31 ENCOUNTER — Encounter (HOSPITAL_COMMUNITY): Payer: Self-pay | Admitting: Surgery

## 2021-01-31 ENCOUNTER — Other Ambulatory Visit (HOSPITAL_COMMUNITY): Payer: Self-pay

## 2021-01-31 ENCOUNTER — Inpatient Hospital Stay (HOSPITAL_COMMUNITY)
Admission: RE | Admit: 2021-01-31 | Discharge: 2021-02-03 | DRG: 331 | Disposition: A | Discharge status: Home / Self Care | Payer: PPO | Source: Ambulatory Visit | Attending: Surgery | Admitting: Surgery

## 2021-01-31 DIAGNOSIS — N400 Enlarged prostate without lower urinary tract symptoms: Secondary | ICD-10-CM | POA: Diagnosis present

## 2021-01-31 DIAGNOSIS — Z8546 Personal history of malignant neoplasm of prostate: Secondary | ICD-10-CM

## 2021-01-31 DIAGNOSIS — M1990 Unspecified osteoarthritis, unspecified site: Secondary | ICD-10-CM | POA: Diagnosis present

## 2021-01-31 DIAGNOSIS — F419 Anxiety disorder, unspecified: Secondary | ICD-10-CM | POA: Diagnosis present

## 2021-01-31 DIAGNOSIS — C181 Malignant neoplasm of appendix: Principal | ICD-10-CM | POA: Diagnosis present

## 2021-01-31 DIAGNOSIS — Z8249 Family history of ischemic heart disease and other diseases of the circulatory system: Secondary | ICD-10-CM

## 2021-01-31 DIAGNOSIS — K219 Gastro-esophageal reflux disease without esophagitis: Secondary | ICD-10-CM | POA: Diagnosis present

## 2021-01-31 DIAGNOSIS — Z8673 Personal history of transient ischemic attack (TIA), and cerebral infarction without residual deficits: Secondary | ICD-10-CM

## 2021-01-31 DIAGNOSIS — Z79899 Other long term (current) drug therapy: Secondary | ICD-10-CM

## 2021-01-31 DIAGNOSIS — E785 Hyperlipidemia, unspecified: Secondary | ICD-10-CM | POA: Diagnosis present

## 2021-01-31 DIAGNOSIS — Z85828 Personal history of other malignant neoplasm of skin: Secondary | ICD-10-CM

## 2021-01-31 DIAGNOSIS — Z880 Allergy status to penicillin: Secondary | ICD-10-CM | POA: Diagnosis not present

## 2021-01-31 DIAGNOSIS — K660 Peritoneal adhesions (postprocedural) (postinfection): Secondary | ICD-10-CM | POA: Diagnosis present

## 2021-01-31 DIAGNOSIS — I10 Essential (primary) hypertension: Secondary | ICD-10-CM | POA: Diagnosis present

## 2021-01-31 DIAGNOSIS — K5790 Diverticulosis of intestine, part unspecified, without perforation or abscess without bleeding: Secondary | ICD-10-CM | POA: Diagnosis present

## 2021-01-31 DIAGNOSIS — Z20822 Contact with and (suspected) exposure to covid-19: Secondary | ICD-10-CM | POA: Diagnosis present

## 2021-01-31 DIAGNOSIS — Z8679 Personal history of other diseases of the circulatory system: Secondary | ICD-10-CM | POA: Diagnosis not present

## 2021-01-31 DIAGNOSIS — Z9049 Acquired absence of other specified parts of digestive tract: Secondary | ICD-10-CM

## 2021-01-31 HISTORY — PX: LAPAROSCOPIC RIGHT HEMI COLECTOMY: SHX5926

## 2021-01-31 LAB — CBC
HCT: 41.1 % (ref 39.0–52.0)
Hemoglobin: 14.1 g/dL (ref 13.0–17.0)
MCH: 32.2 pg (ref 26.0–34.0)
MCHC: 34.3 g/dL (ref 30.0–36.0)
MCV: 93.8 fL (ref 80.0–100.0)
Platelets: 144 10*3/uL — ABNORMAL LOW (ref 150–400)
RBC: 4.38 MIL/uL (ref 4.22–5.81)
RDW: 12.6 % (ref 11.5–15.5)
WBC: 8.4 10*3/uL (ref 4.0–10.5)
nRBC: 0 % (ref 0.0–0.2)

## 2021-01-31 LAB — CREATININE, SERUM
Creatinine, Ser: 0.8 mg/dL (ref 0.61–1.24)
GFR, Estimated: >60 mL/min (ref >60)

## 2021-01-31 SURGERY — LAPAROSCOPIC RIGHT HEMI COLECTOMY
Anesthesia: General | Laterality: Right

## 2021-01-31 MED ORDER — ENSURE PRE-SURGERY PO LIQD
592.0000 mL | Freq: Once | ORAL | Status: DC
  Filled 2021-01-31: qty 592

## 2021-01-31 MED ORDER — LIDOCAINE HCL (PF) 2 % IJ SOLN
INTRAMUSCULAR | Status: AC
  Filled 2021-01-31: qty 5

## 2021-01-31 MED ORDER — SODIUM CHLORIDE 0.9 % IR SOLN
Status: DC | PRN
  Administered 2021-01-31: 2000 mL

## 2021-01-31 MED ORDER — PRAVASTATIN SODIUM 20 MG PO TABS
40.0000 mg | ORAL_TABLET | Freq: Every day | ORAL | Status: DC
  Administered 2021-01-31 – 2021-02-02 (×3): 40 mg via ORAL
  Filled 2021-01-31 (×3): qty 2

## 2021-01-31 MED ORDER — PROPOFOL 10 MG/ML IV BOLUS
INTRAVENOUS | Status: DC | PRN
  Administered 2021-01-31: 90 mg via INTRAVENOUS

## 2021-01-31 MED ORDER — DEXAMETHASONE SODIUM PHOSPHATE 10 MG/ML IJ SOLN
INTRAMUSCULAR | Status: DC | PRN
  Administered 2021-01-31: 10 mg via INTRAVENOUS

## 2021-01-31 MED ORDER — LACTATED RINGERS IV SOLN: INTRAVENOUS | Status: DC

## 2021-01-31 MED ORDER — CLINDAMYCIN PHOSPHATE 900 MG/50ML IV SOLN
900.0000 mg | INTRAVENOUS | Status: AC
  Administered 2021-01-31: 900 mg via INTRAVENOUS
  Filled 2021-01-31: qty 50

## 2021-01-31 MED ORDER — ENSURE PRE-SURGERY PO LIQD
296.0000 mL | Freq: Once | ORAL | Status: DC
  Filled 2021-01-31: qty 296

## 2021-01-31 MED ORDER — ACETAMINOPHEN 10 MG/ML IV SOLN: 1000.0000 mg | Freq: Once | INTRAVENOUS | Status: DC | PRN

## 2021-01-31 MED ORDER — CHLORHEXIDINE GLUCONATE 0.12 % MT SOLN
15.0000 mL | Freq: Once | OROMUCOSAL | Status: AC
  Administered 2021-01-31: 15 mL via OROMUCOSAL

## 2021-01-31 MED ORDER — ONDANSETRON HCL 4 MG/2ML IJ SOLN
INTRAMUSCULAR | Status: DC | PRN
Start: 2021-01-31 — End: 2021-01-31
  Administered 2021-01-31: 4 mg via INTRAVENOUS

## 2021-01-31 MED ORDER — HYDROMORPHONE HCL 1 MG/ML IJ SOLN: 0.5000 mg | INTRAMUSCULAR | Status: DC | PRN

## 2021-01-31 MED ORDER — HEPARIN SODIUM (PORCINE) 5000 UNIT/ML IJ SOLN
5000.0000 [IU] | Freq: Once | INTRAMUSCULAR | Status: AC
  Administered 2021-01-31: 5000 [IU] via SUBCUTANEOUS
  Filled 2021-01-31: qty 1

## 2021-01-31 MED ORDER — BUPIVACAINE LIPOSOME 1.3 % IJ SUSP
INTRAMUSCULAR | Status: DC | PRN
  Administered 2021-01-31: 10 mL

## 2021-01-31 MED ORDER — PROPOFOL 10 MG/ML IV BOLUS
INTRAVENOUS | Status: AC
  Filled 2021-01-31: qty 20

## 2021-01-31 MED ORDER — AMLODIPINE BESYLATE 5 MG PO TABS
5.0000 mg | ORAL_TABLET | Freq: Every day | ORAL | Status: DC
Start: 2021-02-01 — End: 2021-02-03
  Administered 2021-02-01 – 2021-02-02 (×2): 5 mg via ORAL
  Filled 2021-01-31 (×3): qty 1

## 2021-01-31 MED ORDER — ACETAMINOPHEN 500 MG PO TABS
1000.0000 mg | ORAL_TABLET | Freq: Four times a day (QID) | ORAL | Status: DC
  Administered 2021-02-01 – 2021-02-03 (×9): 1000 mg via ORAL
  Filled 2021-01-31 (×11): qty 2

## 2021-01-31 MED ORDER — METRONIDAZOLE 500 MG PO TABS: 1000.0000 mg | ORAL_TABLET | ORAL | Status: DC

## 2021-01-31 MED ORDER — BUPIVACAINE-EPINEPHRINE 0.25% -1:200000 IJ SOLN
INTRAMUSCULAR | Status: DC | PRN
  Administered 2021-01-31: 30 mL

## 2021-01-31 MED ORDER — SUGAMMADEX SODIUM 200 MG/2ML IV SOLN
INTRAVENOUS | Status: DC | PRN
  Administered 2021-01-31: 200 mg via INTRAVENOUS

## 2021-01-31 MED ORDER — HYDRALAZINE HCL 20 MG/ML IJ SOLN: 10.0000 mg | INTRAMUSCULAR | Status: DC | PRN

## 2021-01-31 MED ORDER — LIDOCAINE HCL (CARDIAC) PF 100 MG/5ML IV SOSY
PREFILLED_SYRINGE | INTRAVENOUS | Status: DC | PRN
  Administered 2021-01-31: 100 mg via INTRAVENOUS

## 2021-01-31 MED ORDER — TRAMADOL HCL 50 MG PO TABS: 50.0000 mg | ORAL_TABLET | Freq: Four times a day (QID) | ORAL | Status: DC | PRN

## 2021-01-31 MED ORDER — ENSURE SURGERY PO LIQD
237.0000 mL | Freq: Two times a day (BID) | ORAL | Status: DC
  Administered 2021-02-01 – 2021-02-02 (×4): 237 mL via ORAL

## 2021-01-31 MED ORDER — CHLORHEXIDINE GLUCONATE CLOTH 2 % EX PADS: 6.0000 | MEDICATED_PAD | Freq: Once | CUTANEOUS | Status: DC

## 2021-01-31 MED ORDER — BISACODYL 5 MG PO TBEC: 20.0000 mg | DELAYED_RELEASE_TABLET | Freq: Once | ORAL | Status: DC

## 2021-01-31 MED ORDER — DOXAZOSIN MESYLATE 8 MG PO TABS
8.0000 mg | ORAL_TABLET | Freq: Every day | ORAL | Status: DC
  Administered 2021-01-31 – 2021-02-02 (×3): 8 mg via ORAL
  Filled 2021-01-31 (×3): qty 1

## 2021-01-31 MED ORDER — SIMETHICONE 80 MG PO CHEW
40.0000 mg | CHEWABLE_TABLET | Freq: Four times a day (QID) | ORAL | Status: DC | PRN
  Administered 2021-02-02: 40 mg via ORAL
  Filled 2021-01-31: qty 1

## 2021-01-31 MED ORDER — EPHEDRINE 5 MG/ML INJ
INTRAVENOUS | Status: AC
  Filled 2021-01-31: qty 5

## 2021-01-31 MED ORDER — ACETAMINOPHEN 10 MG/ML IV SOLN
INTRAVENOUS | Status: AC
  Filled 2021-01-31: qty 100

## 2021-01-31 MED ORDER — IBUPROFEN 400 MG PO TABS: 600.0000 mg | ORAL_TABLET | Freq: Four times a day (QID) | ORAL | Status: DC | PRN

## 2021-01-31 MED ORDER — LACTATED RINGERS IV SOLN: INTRAVENOUS | Status: DC | PRN

## 2021-01-31 MED ORDER — NEOMYCIN SULFATE 500 MG PO TABS: 1000.0000 mg | ORAL_TABLET | ORAL | Status: DC

## 2021-01-31 MED ORDER — ORAL CARE MOUTH RINSE: 15.0000 mL | Freq: Once | OROMUCOSAL | Status: AC

## 2021-01-31 MED ORDER — FENTANYL CITRATE PF 50 MCG/ML IJ SOSY
PREFILLED_SYRINGE | INTRAMUSCULAR | Status: AC
  Administered 2021-01-31: 25 ug via INTRAVENOUS
  Filled 2021-01-31: qty 1

## 2021-01-31 MED ORDER — DIPHENHYDRAMINE HCL 12.5 MG/5ML PO ELIX: 12.5000 mg | ORAL_SOLUTION | Freq: Four times a day (QID) | ORAL | Status: DC | PRN

## 2021-01-31 MED ORDER — ALVIMOPAN 12 MG PO CAPS
12.0000 mg | ORAL_CAPSULE | ORAL | Status: AC
  Administered 2021-01-31: 12 mg via ORAL
  Filled 2021-01-31: qty 1

## 2021-01-31 MED ORDER — GENTAMICIN SULFATE 40 MG/ML IJ SOLN
5.0000 mg/kg | INTRAVENOUS | Status: AC
  Administered 2021-01-31: 360 mg via INTRAVENOUS
  Filled 2021-01-31: qty 9

## 2021-01-31 MED ORDER — LACTATED RINGERS IR SOLN
Status: DC | PRN
  Administered 2021-01-31: 3000 mL

## 2021-01-31 MED ORDER — LIDOCAINE HCL (PF) 2 % IJ SOLN
INTRAMUSCULAR | Status: DC | PRN
  Administered 2021-01-31: 1.5 mg/kg/h via INTRADERMAL

## 2021-01-31 MED ORDER — ALUM & MAG HYDROXIDE-SIMETH 200-200-20 MG/5ML PO SUSP: 30.0000 mL | Freq: Four times a day (QID) | ORAL | Status: DC | PRN

## 2021-01-31 MED ORDER — ALVIMOPAN 12 MG PO CAPS
12.0000 mg | ORAL_CAPSULE | Freq: Two times a day (BID) | ORAL | Status: DC
  Administered 2021-02-01 – 2021-02-02 (×2): 12 mg via ORAL
  Filled 2021-01-31 (×3): qty 1

## 2021-01-31 MED ORDER — BUPIVACAINE-EPINEPHRINE (PF) 0.25% -1:200000 IJ SOLN
INTRAMUSCULAR | Status: AC
  Filled 2021-01-31: qty 30

## 2021-01-31 MED ORDER — ONDANSETRON HCL 4 MG/2ML IJ SOLN: 4.0000 mg | Freq: Once | INTRAMUSCULAR | Status: DC | PRN

## 2021-01-31 MED ORDER — BENAZEPRIL HCL 10 MG PO TABS
40.0000 mg | ORAL_TABLET | Freq: Every day | ORAL | Status: DC
  Administered 2021-01-31 – 2021-02-02 (×3): 40 mg via ORAL
  Filled 2021-01-31 (×3): qty 4

## 2021-01-31 MED ORDER — FENTANYL CITRATE (PF) 100 MCG/2ML IJ SOLN
INTRAMUSCULAR | Status: AC
  Filled 2021-01-31: qty 2

## 2021-01-31 MED ORDER — POLYETHYLENE GLYCOL 3350 17 GM/SCOOP PO POWD: 1.0000 | Freq: Once | ORAL | Status: DC

## 2021-01-31 MED ORDER — ROCURONIUM BROMIDE 100 MG/10ML IV SOLN
INTRAVENOUS | Status: DC | PRN
  Administered 2021-01-31: 10 mg via INTRAVENOUS
  Administered 2021-01-31: 30 mg via INTRAVENOUS
  Administered 2021-01-31: 60 mg via INTRAVENOUS

## 2021-01-31 MED ORDER — BUPIVACAINE LIPOSOME 1.3 % IJ SUSP
INTRAMUSCULAR | Status: AC
  Filled 2021-01-31: qty 20

## 2021-01-31 MED ORDER — EPHEDRINE SULFATE (PRESSORS) 50 MG/ML IJ SOLN
INTRAMUSCULAR | Status: DC | PRN
  Administered 2021-01-31 (×2): 10 mg via INTRAVENOUS

## 2021-01-31 MED ORDER — TRAMADOL HCL 50 MG PO TABS
50.0000 mg | ORAL_TABLET | Freq: Four times a day (QID) | ORAL | 0 refills | Status: AC | PRN
  Filled 2021-01-31: qty 15, 4d supply, fill #0

## 2021-01-31 MED ORDER — FENTANYL CITRATE PF 50 MCG/ML IJ SOSY
25.0000 ug | PREFILLED_SYRINGE | INTRAMUSCULAR | Status: DC | PRN
  Administered 2021-01-31: 25 ug via INTRAVENOUS

## 2021-01-31 MED ORDER — ONDANSETRON HCL 4 MG/2ML IJ SOLN
4.0000 mg | Freq: Four times a day (QID) | INTRAMUSCULAR | Status: DC | PRN
  Administered 2021-01-31: 4 mg via INTRAVENOUS
  Filled 2021-01-31: qty 2

## 2021-01-31 MED ORDER — ROCURONIUM BROMIDE 10 MG/ML (PF) SYRINGE
PREFILLED_SYRINGE | INTRAVENOUS | Status: AC
  Filled 2021-01-31: qty 10

## 2021-01-31 MED ORDER — ONDANSETRON HCL 4 MG/2ML IJ SOLN
INTRAMUSCULAR | Status: AC
  Filled 2021-01-31: qty 2

## 2021-01-31 MED ORDER — ONDANSETRON HCL 4 MG PO TABS: 4.0000 mg | ORAL_TABLET | Freq: Four times a day (QID) | ORAL | Status: DC | PRN

## 2021-01-31 MED ORDER — DIPHENHYDRAMINE HCL 50 MG/ML IJ SOLN: 12.5000 mg | Freq: Four times a day (QID) | INTRAMUSCULAR | Status: DC | PRN

## 2021-01-31 MED ORDER — LIDOCAINE HCL 2 % IJ SOLN
INTRAMUSCULAR | Status: AC
  Filled 2021-01-31: qty 20

## 2021-01-31 MED ORDER — FENTANYL CITRATE (PF) 100 MCG/2ML IJ SOLN
INTRAMUSCULAR | Status: DC | PRN
  Administered 2021-01-31: 25 ug via INTRAVENOUS
  Administered 2021-01-31: 50 ug via INTRAVENOUS
  Administered 2021-01-31: 25 ug via INTRAVENOUS

## 2021-01-31 MED ORDER — ACETAMINOPHEN 10 MG/ML IV SOLN
INTRAVENOUS | Status: DC | PRN
  Administered 2021-01-31: 1000 mg via INTRAVENOUS

## 2021-01-31 MED ORDER — DEXAMETHASONE SODIUM PHOSPHATE 10 MG/ML IJ SOLN
INTRAMUSCULAR | Status: AC
  Filled 2021-01-31: qty 1

## 2021-01-31 MED ORDER — HEPARIN SODIUM (PORCINE) 5000 UNIT/ML IJ SOLN
5000.0000 [IU] | Freq: Three times a day (TID) | INTRAMUSCULAR | Status: DC
  Administered 2021-02-01 – 2021-02-03 (×7): 5000 [IU] via SUBCUTANEOUS
  Filled 2021-01-31 (×7): qty 1

## 2021-01-31 SURGICAL SUPPLY — 63 items
APPLIER CLIP ROT 10 11.4 M/L (STAPLE)
BAG COUNTER SPONGE SURGICOUNT (BAG) IMPLANT
BLADE CLIPPER SURG (BLADE) IMPLANT
CABLE HIGH FREQUENCY MONO STRZ (ELECTRODE) ×4 IMPLANT
CELLS DAT CNTRL 66122 CELL SVR (MISCELLANEOUS) IMPLANT
CHLORAPREP W/TINT 26 (MISCELLANEOUS) ×2 IMPLANT
CLIP APPLIE ROT 10 11.4 M/L (STAPLE) IMPLANT
DECANTER SPIKE VIAL GLASS SM (MISCELLANEOUS) ×1 IMPLANT
DERMABOND ADVANCED (GAUZE/BANDAGES/DRESSINGS) ×1
DERMABOND ADVANCED .7 DNX12 (GAUZE/BANDAGES/DRESSINGS) IMPLANT
DISSECTOR BLUNT TIP ENDO 5MM (MISCELLANEOUS) IMPLANT
ELECT REM PT RETURN 15FT ADLT (MISCELLANEOUS) ×2 IMPLANT
GAUZE SPONGE 4X4 12PLY STRL (GAUZE/BANDAGES/DRESSINGS) ×2 IMPLANT
GLOVE SURG ENC MOIS LTX SZ7.5 (GLOVE) ×4 IMPLANT
GLOVE SURG NEOPR MICRO LF SZ8 (GLOVE) ×4 IMPLANT
GOWN STRL REUS W/TWL XL LVL3 (GOWN DISPOSABLE) ×8 IMPLANT
IRRIG SUCT STRYKERFLOW 2 WTIP (MISCELLANEOUS)
IRRIGATION SUCT STRKRFLW 2 WTP (MISCELLANEOUS) IMPLANT
KIT TURNOVER KIT A (KITS) IMPLANT
LIGASURE IMPACT 36 18CM CVD LR (INSTRUMENTS) IMPLANT
NS IRRIG 1000ML POUR BTL (IV SOLUTION) ×2 IMPLANT
PACK COLON (CUSTOM PROCEDURE TRAY) ×2 IMPLANT
PAD POSITIONING PINK XL (MISCELLANEOUS) IMPLANT
PROTECTOR NERVE ULNAR (MISCELLANEOUS) IMPLANT
RELOAD PROXIMATE 75MM BLUE (ENDOMECHANICALS) ×4 IMPLANT
RELOAD STAPLE 75 3.8 BLU REG (ENDOMECHANICALS) IMPLANT
RETRACTOR WND ALEXIS 18 MED (MISCELLANEOUS) IMPLANT
RTRCTR WOUND ALEXIS 18CM MED (MISCELLANEOUS)
SCISSORS LAP 5X35 DISP (ENDOMECHANICALS) ×2 IMPLANT
SEALER TISSUE G2 STRG ARTC 35C (ENDOMECHANICALS) IMPLANT
SET TUBE SMOKE EVAC HIGH FLOW (TUBING) ×2 IMPLANT
SHEARS HARMONIC ACE PLUS 36CM (ENDOMECHANICALS) IMPLANT
SLEEVE ADV FIXATION 5X100MM (TROCAR) ×4 IMPLANT
SLEEVE ENDOPATH XCEL 5M (ENDOMECHANICALS) ×2 IMPLANT
STAPLER 90 3.5 STAND SLIM (STAPLE) ×2
STAPLER 90 3.5 STD SLIM (STAPLE) IMPLANT
STAPLER GUN LINEAR PROX 60 (STAPLE) IMPLANT
STAPLER PROXIMATE 75MM BLUE (STAPLE) ×1 IMPLANT
STAPLER VISISTAT 35W (STAPLE) ×2 IMPLANT
SUT MNCRL AB 4-0 PS2 18 (SUTURE) ×2 IMPLANT
SUT PDS AB 1 CT1 27 (SUTURE) ×4 IMPLANT
SUT PROLENE 2 0 CT2 30 (SUTURE) IMPLANT
SUT PROLENE 2 0 KS (SUTURE) IMPLANT
SUT SILK 2 0 (SUTURE) ×1
SUT SILK 2 0 SH CR/8 (SUTURE) ×2 IMPLANT
SUT SILK 2-0 18XBRD TIE 12 (SUTURE) ×1 IMPLANT
SUT SILK 3 0 (SUTURE) ×1
SUT SILK 3 0 SH CR/8 (SUTURE) ×2 IMPLANT
SUT SILK 3-0 18XBRD TIE 12 (SUTURE) ×1 IMPLANT
SYS LAPSCP GELPORT 120MM (MISCELLANEOUS)
SYS WOUND ALEXIS 18CM MED (MISCELLANEOUS) ×2
SYSTEM LAPSCP GELPORT 120MM (MISCELLANEOUS) IMPLANT
SYSTEM WOUND ALEXIS 18CM MED (MISCELLANEOUS) ×1 IMPLANT
TAPE CLOTH 4X10 WHT NS (GAUZE/BANDAGES/DRESSINGS) IMPLANT
TOWEL OR 17X26 10 PK STRL BLUE (TOWEL DISPOSABLE) ×2 IMPLANT
TRAY FOLEY MTR SLVR 14FR STAT (SET/KITS/TRAYS/PACK) ×2 IMPLANT
TRAY FOLEY MTR SLVR 16FR STAT (SET/KITS/TRAYS/PACK) IMPLANT
TROCAR ADV FIXATION 5X100MM (TROCAR) ×2 IMPLANT
TROCAR BALLN 12MMX100 BLUNT (TROCAR) ×2 IMPLANT
TROCAR XCEL 12X100 BLDLESS (ENDOMECHANICALS) ×2 IMPLANT
TROCAR XCEL NON-BLD 11X100MML (ENDOMECHANICALS) IMPLANT
TUBING CONNECTING 10 (TUBING) ×4 IMPLANT
YANKAUER SUCT BULB TIP NO VENT (SUCTIONS) ×2 IMPLANT

## 2021-01-31 NOTE — Anesthesia Postprocedure Evaluation (Signed)
Anesthesia Post Note  Patient: Chad Ford  Procedure(s) Performed: LAPAROSCOPIC RIGHT HEMI COLECTOMY WITH TAPPS BLOCK (Right)     Patient location during evaluation: PACU Anesthesia Type: General Level of consciousness: awake and alert Pain management: pain level controlled Vital Signs Assessment: post-procedure vital signs reviewed and stable Respiratory status: spontaneous breathing, nonlabored ventilation, respiratory function stable and patient connected to nasal cannula oxygen Cardiovascular status: blood pressure returned to baseline and stable Postop Assessment: no apparent nausea or vomiting Anesthetic complications: no   No notable events documented.  Last Vitals:  Vitals:   01/31/21 1700 01/31/21 1715  BP: 136/66 135/67  Pulse: 63 (!) 57  Resp: 16 17  Temp:    SpO2: 93% 94%    Last Pain:  Vitals:   01/31/21 1715  TempSrc:   PainSc: 4                  Barnet [place]

## 2021-01-31 NOTE — Op Note (Signed)
PATIENT: Chad Ford  81 y.o. male  Patient Care Team: Aletha Halim., PA-C as PCP - General (Family Medicine) Clark-Burning, Anderson Malta, PA-C (Inactive) (Dermatology)  PREOP DIAGNOSIS: Adenocarcinoma of the appendix  POSTOP DIAGNOSIS: Same  PROCEDURE:  Laparoscopic right hemicolectomy Bilateral transversus abdominus plane blocks  SURGEON: Sharon Mt. Chayna Surratt, MD  ASSISTANT: Leighton Ruff, MD  ANESTHESIA: General endotracheal  EBL: 150 mL Total I/O In: 559 [I.V.:300; IV Piggyback:259] Out: 450 [Urine:300; Blood:150]  DRAINS: None  SPECIMEN: Right colon (terminal ileum, cecum, ascending colon, proximal transverse colon)  COUNTS: Sponge, needle and instrument counts were reported correct x2  FINDINGS:  Appendiceal staple line healthy without evident mass.  Right hemicolectomy carried out including right branch of middle colics with a stapled ileotransverse colon anastomosis.  Fairly vascular mesentery with lots of tributaries.  Normal-appearing liver.  Normal-appearing peritoneal surfaces.    NARRATIVE:  The patient was identified & brought into the operating room, placed supine on the operating table and SCDs were applied to the lower extremities. General endotracheal anesthesia was induced. The patient was positioned supine with arms tucked. Antibiotics were administered. A foley catheter was placed under sterile conditions. Hair in the region of planned surgery was clipped. The abdomen was prepped and draped in a sterile fashion. A timeout was performed confirming our patient and plan.   Beginning with the extraction port, a supraumbilical incision was made and carried down to the midline fascia. This was then incised with electrocautery. The peritoneum was identified and elevated between clamps and carefully opened sharply. A small Alexis wound protector with a cap and associated port was then placed. The abdomen was insufflated to 15 mmHg with Co2. A laparoscope was  placed and camera inspection revealed no evidence of injury. Bilateral TAP blocks were then performed under laparoscopic visualization using a mixture of 0.25% marcaine with epinepherine + Exparel. 3 additional ports were then placed under direct laparoscopic visualization - two in the left hemiabdomen and one in the right abdomen. The abdomen was surveyed. The liver and peritoneum appeared normal.  The appendiceal staple line on the cecum was inspected and intact and healthy in appearance.  There is no evident masses at this location either.  There were no signs of metastatic disease.  The patient was positioned in trendelenburg with left side down.  There were some adhesions of his distal ileum to his right abdominal wall that were carefully lysed sharply.  The serosal surfaces were then inspected and there is no evidence of serosal injury.  The ileum was fully mobilized including its associated mesentery taking care to stay above the plane within the retroperitoneum where his gonadal vessels and ureter reside.  We then began by mobilizing his colon in a lateral to medial fashion as it does appear he has a fairly vascular mesentery with lots of tributaries.  The Cahlil Sattar line of Toldt was incised at the level of the cecum and this was carried all the way up to and including part of the hepatic flexure.  The associated mesocolon was mobilized medially.    We then repositioned him in reverse Trendelenburg.  The omentum was elevated anteriorly and the transverse colon retracted caudad.  The lesser sac was gained and the omentum mobilized off of the transverse colon all the way over to the hepatic flexure.  The duodenum was identified and protected free of injury.  He does have a fairly high loop of colon extending up underneath his liver.  We are able to gently retract the  liver to facilitate [place] visualizing his hepatic flexure.  The hepatic flexure was then fully mobilized.  Again, the associate mesocolon is  carefully dissected free from the underlying duodenum.  At this point, the distal ileum easily reached in the left upper quadrant out any difficulty.  The retroperitoneum was inspected and noted to be hemostatic.  A grasper was placed and the extracorporeal portion of the procedure carried out.  At this point, the abdomen was desufflated and the terminal ileum and right colon delivered through the wound protector. The terminal ileum was then transected using a GIA blue load stapler.  A Babcock was placed on the proximal staple line side of the ileum to assist in maintaining orientation.  The distal point of transection was identified on the transverse colon at a location that included the right branch of the middle colic's of the specimen.  75 mm GIA blue load stapler was utilized to divide the colon at this location.  The intervening mesentery was then divided using the Enseal device.  Prior to dividing the ileocolic pedicle, we did again confirm that we were well away from our duodenum.  The ileocolic pedicle was then ligated and divided with the Enseal.  The cut edge of the mesentery is inspected noted to be hemostatic.  Specimen is passed off  Attention was turned to creating the anastomosis. The terminal ileum and transverse colon were inspected for orientation to ensure no twisting nor bowel included in the mesenteric defect.  Both ends appear well perfused and there is a palpable pulse in the mesentery going out to the cut edge of both the distal ileum and the transverse colon.  An anastomosis was created between the terminal ileum and the transverse colon using a 75 mm GIA blue load stapler. The staple line was inspected and noted to be hemostatic.  The common enterotomy channel was closed using a TA 90 blue load stapler, including the redundant end of ileum with this firing. Hemostasis was achieved at the staple line using 3-0 silk U-stitches. 3-0 silk sutures were used to imbricate the corners of the  staple line as well.  A 2-0 silk suture was placed securing the apex of the anastomosis. The anastomosis was palpated and noted to be widely patent. This was then placed back into the abdomen. The abdomen was then irrigated with sterile saline and hemostasis verified. The omentum was then brought down over the anastomosis. The wound protector cap was replaced and CO2 reinsufflated.  Hemostasis is appreciated in the abdomen.  The laparoscopic ports were removed under direct visualization and the sites noted to be hemostatic. The Alexis wound protector was removed, counts were reported correct, and we switched to clean instruments, gowns and drapes.   The extraction site fascia was then closed using two running #1 PDS sutures.  The skin of all incision sites was closed with 4-0 monocryl subcuticular suture. Dermabond was placed on the port sites and a sterile dressing was placed over the abdominal incision. All counts were reported correct. He was then awakened from anesthesia and transported to PACU in satisfactory condition.   DISPOSITION: PACU in satisfactory condition

## 2021-01-31 NOTE — Anesthesia Procedure Notes (Signed)
Procedure Name: Intubation Date/Time: 01/31/2021 2:39 PM Performed by: Aline Brochure, CRNA Pre-anesthesia Checklist: Patient identified, Patient being monitored, Timeout performed, Emergency Drugs available and Suction available Patient Re-evaluated:Patient Re-evaluated prior to induction Oxygen Delivery Method: Circle system utilized Preoxygenation: Pre-oxygenation with 100% oxygen Induction Type: IV induction Ventilation: Mask ventilation without difficulty Laryngoscope Size: Mac and 4 Grade View: Grade I Tube type: Oral Tube size: 7.5 mm Number of attempts: 1 Airway Equipment and Method: Stylet Placement Confirmation: ETT inserted through vocal cords under direct vision, positive ETCO2 and breath sounds checked- equal and bilateral Secured at: 22 cm Tube secured with: Tape Dental Injury: Teeth and Oropharynx as per pre-operative assessment

## 2021-01-31 NOTE — H&P (Signed)
CC: Here today for surgery - appendiceal cancer  HPI: Chad Ford is an 81 y.o. male with history of HTN, afib (not on anticoagulation), HLD, whom is seen in the office today as a referral by Dr. Deatra Ina for evaluation of appendiceal cancer.  He had presented emergency department 09/03/2020 with acute onset of right lower quadrant pain. CT scan demonstrated acute appendicitis without perforation or abscess. Indeterminate 5 mm lesion involving the posterior aspect of the uncinate process. Possible MRI recommended as an outpatient.  He was taken to the OR by my partner, Dr. Brantley Stage, and underwent appendectomy.  Pathology demonstrated adenocarcinoma with neuroendocrine features, moderately differentiated. Proximal margin uninvolved by adenocarcinoma. Involves serosa. Wall noted to be disrupted at the tip. PT4pNx.  Reading Dr. Josetta Huddle operative note, there is no evident perforation at the time of the surgery. There were no abscesses. There was noted to be some necrosis.  He recovered well and was discharged home the following day.  Follow-up CT chest 10/02/2020 showed no evidence of metastatic disease.  Last colonoscopy 11/2020 with Dr. Benson Norway -we do not have a copy of this report. He does report that he was told he had diverticulosis which was noted when he had a colonoscopy in 2016. He reports his colonoscopy was otherwise normal  PMH: HTN, afib (not on anticoagulation), HLD, prostate cancer (s/p brachytherapy)  PSH: Lap appy 08/2020; inguinal hernia surgery  FHx: Denies any known family history of colorectal, breast, endometrial or ovarian cancer  Social Hx: Denies use of tobacco/EtOH/illicit drug. 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 He does a fair amount of lifting including 30 pound leaf blower in the tailgate of his trailer.  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   Right Forearm (Keratoacanthoma) (curet and 5FU)   Superficial basal cell carcinoma (BCC) 06/16/2019   Left Upper Arm - Anterior   Vertigo    treats with specific exercises    Past Surgical History:  Procedure Laterality Date   CIRCUMCISION     as a child   COLONOSCOPY     COLONOSCOPY W/ POLYPECTOMY     HEMORRHOID SURGERY  2000   HERNIA REPAIR Right 2002   Inguinal   INGUINAL HERNIA REPAIR Left 12/31/2015   Procedure: HERNIA REPAIR INGUINAL ADULT;  Surgeon: Georganna Skeans, MD;  Location: [place];  Service: General;  Laterality: Left;   INSERTION OF MESH Left 12/31/2015   Procedure: INSERTION OF MESH;  Surgeon: Georganna Skeans, MD;  Location: [place];  Service: General;  Laterality: Left;   LAPAROSCOPIC APPENDECTOMY N/A 09/04/2020   Procedure: APPENDECTOMY LAPAROSCOPIC;  Surgeon: Erroll Luna, MD;  Location: [place];  Service: General;  Laterality: N/A;   RADIOACTIVE SEED IMPLANT N/A 10/28/2019  Procedure: RADIOACTIVE SEED IMPLANT/BRACHYTHERAPY IMPLANT;  Surgeon: Janith Lima, MD;  Location: [facility];  Service: Urology;  Laterality: N/A;   SPACE OAR INSTILLATION N/A 10/28/2019   Procedure: SPACE OAR INSTILLATION;  Surgeon: Janith Lima, MD;  Location: [facility];  Service: Urology;  Laterality: N/A;   TONSILLECTOMY     as a child    Family History  Problem Relation Age of Onset   Hypertension Mother     Hypertension Father     Social:  reports that he has never smoked. He has never used smokeless tobacco. He reports that he does not drink alcohol and does not use drugs.  Allergies:  Allergies  Allergen Reactions   Penicillins Hives and Swelling    SWELLING REACTION UNSPECIFIED, FROM CHILDHOOD.  Has patient had a PCN reaction causing immediate rash, facial/tongue/throat swelling, SOB or lightheadedness with hypotension: Unknown Has patient had a PCN reaction causing severe rash involving mucus membranes or skin necrosis: No Has patient had a PCN reaction that required hospitalization: No Has patient had a PCN reaction occurring within the last 10 years: No If all of the above answers are "NO", then may proceed with Cephalosporin use.     Medications: I have reviewed the patient's current medications.  No results found for this or any previous visit (from the past 48 hour(s)).  No results found.  ROS - all of the below systems have been reviewed with the patient and positives are indicated with bold text General: chills, fever or night sweats Eyes: blurry vision or double vision ENT: epistaxis or sore throat Allergy/Immunology: itchy/watery eyes or nasal congestion Hematologic/Lymphatic: bleeding problems, blood clots or swollen lymph nodes Endocrine: temperature intolerance or unexpected weight changes Breast: new or changing breast lumps or nipple discharge Resp: cough, shortness of breath, or wheezing CV: chest pain or dyspnea on exertion GI: as per HPI GU: dysuria, trouble voiding, or hematuria MSK: joint pain or joint stiffness Neuro: TIA or stroke symptoms Derm: pruritus and skin lesion changes Psych: anxiety and depression  PE There were no vitals taken for this visit. Constitutional: NAD; conversant Eyes: Moist conjunctiva; no lid lag; anicteric Lungs: Normal respiratory effort CV: RRR GI: Abd soft, NT/ND; no palpable hepatosplenomegaly MSK: Normal range of  motion of extremities Psychiatric: Appropriate affect; alert and oriented x3  No results found for this or any previous visit (from the past 48 hour(s)).  No results found.  A/P: Chad Ford is an 81 y.o. male with hx of HTN, afib (not on anticoagulation), HLD, here for evaluation of appendiceal cancer incidentally found at time of appendectomy for acute appendicitis. This was an invasive adenocarcinoma with neuroendocrine features. 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 Repeat MRCP in 2 years   -Medical clearance obtained 12/25/20 - Dr. Maylene Roes (cardiology) & Deatra Ina [facility] (primary care) -Cscope from Dr. Benson Norway uploaded to [facility] - completed 10/2020 - no abnormality noted aside from diverticulosis  -The anatomy and physiology of the GI tract was reviewed with the patient. The pathophysiology of appendiceal cancers was discussed as well with associated pictures. -We have discussed various different treatment options going forward including surveillance/further observation versus surgery-laparoscopic right hemicolectomy. We discussed potential lymph node metastases associated with a pT4 tumor. For these reasons, he would like to proceed with surgery. -The planned procedure, material risks (including, but not limited to, pain, bleeding, infection, scarring, need for  blood transfusion, damage to surrounding structures- blood vessels/nerves/viscus/organs, damage to ureter, urine leak, leak from anastomosis, need for additional procedures, scenarios where a stoma may be necessary and where it may be permanent, worsening of pre-existing medical conditions, hernia, recurrence, pneumonia, heart attack, stroke, death) benefits and alternatives to surgery were discussed at length. The patient's questions were answered to his satisfaction, he voiced understanding and elected to proceed with  surgery. Additionally, we discussed typical postoperative expectations and the recovery process.  Nadeen Landau, MD [facility], [facility]

## 2021-01-31 NOTE — Transfer of Care (Signed)
Immediate Anesthesia Transfer of Care Note  Patient: Chad Ford  Procedure(s) Performed: LAPAROSCOPIC RIGHT HEMI COLECTOMY WITH TAPPS BLOCK (Right)  Patient Location: PACU  Anesthesia Type:General  Level of Consciousness: awake, alert  and oriented  Airway & Oxygen Therapy: Patient Spontanous Breathing and Patient connected to face mask oxygen  Post-op Assessment: Report given to RN and Post -op Vital signs reviewed and unstable, Anesthesiologist notified  Post vital signs: Reviewed and stable  Last Vitals:  Vitals Value Taken Time  BP 139/73 01/31/21 1641  Temp 36.4 C 01/31/21 1641  Pulse 65 01/31/21 1644  Resp 22 01/31/21 1644  SpO2 99 % 01/31/21 1644  Vitals shown include unvalidated device data.  Last Pain:  Vitals:   01/31/21 1207  TempSrc:   PainSc: 0-No pain      Patients Stated Pain Goal: 4 (28/83/37 4451)  Complications: No notable events documented.

## 2021-02-01 ENCOUNTER — Other Ambulatory Visit: Payer: Self-pay

## 2021-02-01 ENCOUNTER — Other Ambulatory Visit (HOSPITAL_COMMUNITY): Payer: Self-pay

## 2021-02-01 ENCOUNTER — Encounter (HOSPITAL_COMMUNITY): Payer: Self-pay | Admitting: Surgery

## 2021-02-01 LAB — CBC
HCT: 39.6 % (ref 39.0–52.0)
Hemoglobin: 13.2 g/dL (ref 13.0–17.0)
MCH: 31.7 pg (ref 26.0–34.0)
MCHC: 33.3 g/dL (ref 30.0–36.0)
MCV: 95 fL (ref 80.0–100.0)
Platelets: 154 10*3/uL (ref 150–400)
RBC: 4.17 MIL/uL — ABNORMAL LOW (ref 4.22–5.81)
RDW: 12.4 % (ref 11.5–15.5)
WBC: 7.7 10*3/uL (ref 4.0–10.5)
nRBC: 0 % (ref 0.0–0.2)

## 2021-02-01 LAB — BASIC METABOLIC PANEL
Anion gap: 18 — ABNORMAL HIGH (ref 5–15)
BUN: 10 mg/dL (ref 8–23)
CO2: 24 mmol/L (ref 22–32)
Calcium: 8.2 mg/dL — ABNORMAL LOW (ref 8.9–10.3)
Chloride: 99 mmol/L (ref 98–111)
Creatinine, Ser: 0.98 mg/dL (ref 0.61–1.24)
GFR, Estimated: >60 mL/min (ref >60)
Glucose, Bld: 162 mg/dL — ABNORMAL HIGH (ref 70–99)
Potassium: 3.7 mmol/L (ref 3.5–5.1)
Sodium: 141 mmol/L (ref 135–145)

## 2021-02-01 MED ORDER — LIP MEDEX EX OINT
TOPICAL_OINTMENT | CUTANEOUS | Status: DC | PRN
  Administered 2021-02-01: 1 via TOPICAL
  Filled 2021-02-01: qty 7

## 2021-02-01 NOTE — Progress Notes (Signed)
°  Transition of Care [facility]) Screening Note   Patient Details  Name: Chad Ford Date of Birth: 1940-02-12   Transition of Care [facility]) CM/SW Contact:    Lennart Pall, LCSW Phone Number: 02/01/2021, 10:16 AM    Transition of Care Department [facility]) has reviewed patient and no TOC needs have been identified at this time. We will continue to monitor patient advancement through interdisciplinary progression rounds. If new patient transition needs arise, please place a TOC consult.

## 2021-02-01 NOTE — Discharge Instructions (Signed)
POST OP INSTRUCTIONS AFTER COLON SURGERY  DIET: Be sure to include lots of fluids daily to stay hydrated - 64oz of water per day (8, 8 oz glasses).  Avoid fast food or heavy meals for the first couple of weeks as your are more likely to get nauseated. Avoid raw/uncooked fruits or vegetables for the first 4 weeks (its ok to have these if they are blended into smoothie form). If you have fruits/vegetables, make sure they are cooked until soft enough to mash on the roof of your mouth and chew your food well. Otherwise, diet as tolerated.  Take your usually prescribed home medications unless otherwise directed.  PAIN CONTROL: Pain is best controlled by a usual combination of three different methods TOGETHER: Ice/Heat Over the counter pain medication Prescription pain medication Most patients will experience some swelling and bruising around the surgical site.  Ice packs or heating pads (30-60 minutes up to 6 times a day) will help. Some people prefer to use ice alone, heat alone, alternating between ice & heat.  Experiment to what works for you.  Swelling and bruising can take several weeks to resolve.   It is helpful to take an over-the-counter pain medication regularly for the first few weeks: Ibuprofen (Motrin/Advil) - 200mg tabs - take 3 tabs (600mg) every 6 hours as needed for pain (unless you have been directed previously to avoid NSAIDs/ibuprofen) Acetaminophen (Tylenol) - you may take 650mg every 6 hours as needed. You can take this with motrin as they act differently on the body. If you are taking a narcotic pain medication that has acetaminophen in it, do not take over the counter tylenol at the same time. NOTE: You may take both of these medications together - most patients  find it most helpful when alternating between the two (i.e. Ibuprofen at 6am, tylenol at 9am, ibuprofen at 12pm ...) A  prescription for pain medication should be given to you upon discharge.  Take your pain medication as  prescribed if your pain is not adequatly controlled with the over-the-counter pain reliefs mentioned above.  Avoid getting constipated.  Between the surgery and the pain medications, it is common to experience some constipation.  Increasing fluid intake and taking a fiber supplement (such as Metamucil, Citrucel, FiberCon, MiraLax, etc) 1-2 times a day regularly will usually help prevent this problem from occurring.  A mild laxative (prune juice, Milk of Magnesia, MiraLax, etc) should be taken according to package directions if there are no bowel movements after 48 hours.    Dressing: Your incisions are covered in Dermabond which is like sterile superglue for the skin. This will come off on it's own in a couple weeks. It is waterproof and you may bathe normally starting the day after your surgery in a shower. Avoid baths/pools/lakes/oceans until your wounds have fully healed.  ACTIVITIES as tolerated:   Avoid heavy lifting (>10lbs or 1 gallon of milk) for the next 6 weeks. You may resume regular daily activities as tolerated--such as daily self-care, walking, climbing stairs--gradually increasing activities as tolerated.  If you can walk 30 minutes without difficulty, it is safe to try more intense activity such as jogging, treadmill, bicycling, low-impact aerobics.  DO NOT PUSH THROUGH PAIN.  Let pain be your guide: If it hurts to do something, don't do it. You may drive when you are no longer taking prescription pain medication, you can comfortably wear a seatbelt, and you can safely maneuver your car and apply brakes.  FOLLOW UP in our   office Please call [facility] at [phone] to set up an appointment to see your surgeon in the office for a follow-up appointment approximately 2 weeks after your surgery. Make sure that you call for this appointment the day you arrive home to insure a convenient appointment time.  9. If you have disability or family leave forms that need to be completed, you may have  them completed by your primary care physician's office; for return to work instructions, please ask our office staff and they will be happy to assist you in obtaining this documentation   When to call us [phone]: Poor pain control Reactions / problems with new medications (rash/itching, etc)  Fever over 101.5 F (38.5 C) Inability to urinate Nausea/vomiting Worsening swelling or bruising Continued bleeding from incision. Increased pain, redness, or drainage from the incision  The clinic staff is available to answer your questions during regular business hours (8:30am-5pm).  Please don't hesitate to call and ask to speak to one of our nurses for clinical concerns.   A surgeon from [facility] is always on call at the hospitals   If you have a medical emergency, go to the nearest emergency room or call 911.  [facility] [address], [address], [place], [place]  [address] MAIN: [phone] FAX: [phone] [email or web address]  

## 2021-02-01 NOTE — Progress Notes (Addendum)
°  Subjective No acute events. Feeling well. No n/v. Ambulating. Pain well controlled. Denies flatus/bm as of yet.  Objective: Vital signs in last 24 hours: Temp:  [97.5 F (36.4 C)-98.8 F (37.1 C)] 98.1 F (36.7 C) (01/20 0448) Pulse Rate:  [52-83] 74 (01/20 0448) Resp:  [15-20] 17 (01/20 0448) BP: (125-153)/(66-75) 125/68 (01/20 0448) SpO2:  [93 %-100 %] 97 % (01/20 0448) Weight:  [71.2 kg] 71.2 kg (01/19 1207)    Intake/Output from previous day: 01/19 0701 - 01/20 0700 In: 2845 [P.O.:720; I.V.:1866; IV Piggyback:259] Out: 2050 [Urine:1900; Blood:150] Intake/Output this shift: Total I/O In: 120 [P.O.:120] Out: 300 [Urine:300]  Gen: NAD, comfortable CV: RRR Pulm: Normal work of breathing Abd: Soft, NT/ND. Incisions c/d/I without erythema nor drainage Ext: SCDs in place  Lab Results: CBC  Recent Labs    01/31/21 1927 02/01/21 0400  WBC 8.4 7.7  HGB 14.1 13.2  HCT 41.1 39.6  PLT 144* 154   BMET Recent Labs    01/31/21 1927 02/01/21 0400  NA  --  141  K  --  3.7  CL  --  99  CO2  --  24  GLUCOSE  --  162*  BUN  --  10  CREATININE 0.80 0.98  CALCIUM  --  8.2*   PT/INR No results for input(s): LABPROT, INR in the last 72 hours. ABG No results for input(s): PHART, HCO3 in the last 72 hours.  Invalid input(s): PCO2, PO2  Studies/Results:  Anti-infectives: Anti-infectives (From admission, onward)    Start     Dose/Rate Route Frequency Ordered Stop   01/31/21 1400  neomycin (MYCIFRADIN) tablet 1,000 mg  Status:  Discontinued       See Hyperspace for full Linked Orders Report.   1,000 mg Oral 3 times per day 01/31/21 1144 01/31/21 1158   01/31/21 1400  metroNIDAZOLE (FLAGYL) tablet 1,000 mg  Status:  Discontinued       See Hyperspace for full Linked Orders Report.   1,000 mg Oral 3 times per day 01/31/21 1144 01/31/21 1158   01/31/21 1144  clindamycin (CLEOCIN) IVPB 900 mg       See Hyperspace for full Linked Orders Report.   900 mg 100 mL/hr over  30 Minutes Intravenous 60 min pre-op 01/31/21 1144 01/31/21 1450   01/31/21 1144  gentamicin (GARAMYCIN) 360 mg in dextrose 5 % 100 mL IVPB       See Hyperspace for full Linked Orders Report.   5 mg/kg  71.2 kg 109 mL/hr over 60 Minutes Intravenous 60 min pre-op 01/31/21 1144 01/31/21 1500        Assessment/Plan: Patient Active Problem List   Diagnosis Date Noted   S/P right hemicolectomy 01/31/2021   Appendicitis 09/03/2020   Malignant neoplasm of prostate ([place]) 08/26/2019   s/p Procedure(s): LAPAROSCOPIC RIGHT HEMI COLECTOMY WITH TAPPS BLOCK 01/31/2021  -Doing well -Full liquids, advance to soft as tolerated -Continue Entereg today, awaiting return of bowel fxn -D/C Foley -D/C IVF -Ambulate 5x/day -Ppx: SQH, SCDs  -We spent time reviewing his procedure, findings, plans moving forward and expectations therein   LOS: 1 day   Nadeen Landau, MD [facility], [place]

## 2021-02-01 NOTE — Progress Notes (Signed)
°   02/01/21 1100  Mobility  Activity Ambulated with assistance in hallway  Level of Assistance Modified independent, requires aide device or extra time  Assistive Device None  Distance Ambulated (ft) 400 ft  Activity Response Tolerated well  $Mobility charge 1 Mobility   Pt agreeable to mobilize this morning. Ambulated about 431ft in hall, tolerated well. No complaints. Left pt in chair with call bell at side.   [place] Specialist Acute Rehab Services Office: [phone]

## 2021-02-02 LAB — CBC
HCT: 37 % — ABNORMAL LOW (ref 39.0–52.0)
Hemoglobin: 12.3 g/dL — ABNORMAL LOW (ref 13.0–17.0)
MCH: 31.9 pg (ref 26.0–34.0)
MCHC: 33.2 g/dL (ref 30.0–36.0)
MCV: 95.9 fL (ref 80.0–100.0)
Platelets: 143 10*3/uL — ABNORMAL LOW (ref 150–400)
RBC: 3.86 MIL/uL — ABNORMAL LOW (ref 4.22–5.81)
RDW: 12.9 % (ref 11.5–15.5)
WBC: 7 10*3/uL (ref 4.0–10.5)
nRBC: 0 % (ref 0.0–0.2)

## 2021-02-02 LAB — BASIC METABOLIC PANEL
Anion gap: 6 (ref 5–15)
BUN: 18 mg/dL (ref 8–23)
CO2: 27 mmol/L (ref 22–32)
Calcium: 8.5 mg/dL — ABNORMAL LOW (ref 8.9–10.3)
Chloride: 105 mmol/L (ref 98–111)
Creatinine, Ser: 0.99 mg/dL (ref 0.61–1.24)
GFR, Estimated: >60 mL/min (ref >60)
Glucose, Bld: 103 mg/dL — ABNORMAL HIGH (ref 70–99)
Potassium: 3.5 mmol/L (ref 3.5–5.1)
Sodium: 138 mmol/L (ref 135–145)

## 2021-02-02 NOTE — Progress Notes (Signed)
2 Days Post-Op   Subjective/Chief Complaint: Complains of some pain overnight. Seems better today   Objective: Vital signs in last 24 hours: Temp:  [97.8 F (36.6 C)-98.6 F (37 C)] 98.6 F (37 C) (01/20 2026) Pulse Rate:  [65-74] 66 (01/20 2026) Resp:  [16-18] 18 (01/20 2026) BP: (122-135)/(54-70) 124/67 (01/20 2026) SpO2:  [95 %-96 %] 95 % (01/20 2026) Last BM Date: 02/01/21  Intake/Output from previous day: 01/20 0701 - 01/21 0700 In: 1138.1 [P.O.:1080; I.V.:58.1] Out: 700 [Urine:700] Intake/Output this shift: Total I/O In: 240 [P.O.:240] Out: -   General appearance: alert and cooperative Resp: clear to auscultation bilaterally Cardio: regular rate and rhythm GI: soft, mild tenderness. Good bs. Incision looks good  Lab Results:  Recent Labs    02/01/21 0400 02/02/21 0419  WBC 7.7 7.0  HGB 13.2 12.3*  HCT 39.6 37.0*  PLT 154 143*   BMET Recent Labs    02/01/21 0400 02/02/21 0419  NA 141 138  K 3.7 3.5  CL 99 105  CO2 24 27  GLUCOSE 162* 103*  BUN 10 18  CREATININE 0.98 0.99  CALCIUM 8.2* 8.5*   PT/INR No results for input(s): LABPROT, INR in the last 72 hours. ABG No results for input(s): PHART, HCO3 in the last 72 hours.  Invalid input(s): PCO2, PO2  Studies/Results: No results found.  Anti-infectives: Anti-infectives (From admission, onward)    Start     Dose/Rate Route Frequency Ordered Stop   01/31/21 1400  neomycin (MYCIFRADIN) tablet 1,000 mg  Status:  Discontinued       See Hyperspace for full Linked Orders Report.   1,000 mg Oral 3 times per day 01/31/21 1144 01/31/21 1158   01/31/21 1400  metroNIDAZOLE (FLAGYL) tablet 1,000 mg  Status:  Discontinued       See Hyperspace for full Linked Orders Report.   1,000 mg Oral 3 times per day 01/31/21 1144 01/31/21 1158   01/31/21 1144  clindamycin (CLEOCIN) IVPB 900 mg       See Hyperspace for full Linked Orders Report.   900 mg 100 mL/hr over 30 Minutes Intravenous 60 min pre-op  01/31/21 1144 01/31/21 1450   01/31/21 1144  gentamicin (GARAMYCIN) 360 mg in dextrose 5 % 100 mL IVPB       See Hyperspace for full Linked Orders Report.   5 mg/kg  71.2 kg 109 mL/hr over 60 Minutes Intravenous 60 min pre-op 01/31/21 1144 01/31/21 1500       Assessment/Plan: s/p Procedure(s): LAPAROSCOPIC RIGHT HEMI COLECTOMY WITH TAPPS BLOCK (Right) Advance diet Patient Active Problem List    Diagnosis Date Noted   S/P right hemicolectomy 01/31/2021   Appendicitis 09/03/2020   Malignant neoplasm of prostate ([place]) 08/26/2019    s/p Procedure(s): LAPAROSCOPIC RIGHT HEMI COLECTOMY WITH TAPPS BLOCK 01/31/2021   -Doing well -Full liquids, advance to soft as tolerated -Continue Entereg today, awaiting return of bowel fxn -D/C Foley -D/C IVF -Ambulate 5x/day -Ppx: SQH, SCDs POD 2  LOS: 2 days    Chad Ford 02/02/2021

## 2021-02-03 LAB — CBC
HCT: 37.4 % — ABNORMAL LOW (ref 39.0–52.0)
Hemoglobin: 12.3 g/dL — ABNORMAL LOW (ref 13.0–17.0)
MCH: 31.9 pg (ref 26.0–34.0)
MCHC: 32.9 g/dL (ref 30.0–36.0)
MCV: 96.9 fL (ref 80.0–100.0)
Platelets: 148 10*3/uL — ABNORMAL LOW (ref 150–400)
RBC: 3.86 MIL/uL — ABNORMAL LOW (ref 4.22–5.81)
RDW: 13.1 % (ref 11.5–15.5)
WBC: 4.7 10*3/uL (ref 4.0–10.5)
nRBC: 0 % (ref 0.0–0.2)

## 2021-02-03 LAB — BASIC METABOLIC PANEL
Anion gap: 6 (ref 5–15)
BUN: 17 mg/dL (ref 8–23)
CO2: 27 mmol/L (ref 22–32)
Calcium: 8.7 mg/dL — ABNORMAL LOW (ref 8.9–10.3)
Chloride: 106 mmol/L (ref 98–111)
Creatinine, Ser: 0.86 mg/dL (ref 0.61–1.24)
GFR, Estimated: >60 mL/min (ref >60)
Glucose, Bld: 84 mg/dL (ref 70–99)
Potassium: 3.8 mmol/L (ref 3.5–5.1)
Sodium: 139 mmol/L (ref 135–145)

## 2021-02-03 NOTE — Plan of Care (Signed)
°  Problem: Education: Goal: Knowledge of General Education information will improve Description: Including pain rating scale, medication(s)/side effects and non-pharmacologic comfort measures 02/03/2021 1137 by Tanda Rockers, RN Outcome: Adequate for Discharge 02/03/2021 1137 by Tanda Rockers, RN Outcome: Progressing   Problem: Health Behavior/Discharge Planning: Goal: Ability to manage health-related needs will improve 02/03/2021 1137 by Tanda Rockers, RN Outcome: Adequate for Discharge 02/03/2021 1137 by Tanda Rockers, RN Outcome: Progressing   Problem: Clinical Measurements: Goal: Ability to maintain clinical measurements within normal limits will improve 02/03/2021 1137 by Tanda Rockers, RN Outcome: Adequate for Discharge 02/03/2021 1137 by Tanda Rockers, RN Outcome: Progressing Goal: Will remain free from infection 02/03/2021 1137 by Tanda Rockers, RN Outcome: Adequate for Discharge 02/03/2021 1137 by Tanda Rockers, RN Outcome: Progressing Goal: Diagnostic test results will improve 02/03/2021 1137 by Tanda Rockers, RN Outcome: Adequate for Discharge 02/03/2021 1137 by Tanda Rockers, RN Outcome: Progressing Goal: Respiratory complications will improve 02/03/2021 1137 by Tanda Rockers, RN Outcome: Adequate for Discharge 02/03/2021 1137 by Tanda Rockers, RN Outcome: Progressing Goal: Cardiovascular complication will be avoided 02/03/2021 1137 by Tanda Rockers, RN Outcome: Adequate for Discharge 02/03/2021 1137 by Tanda Rockers, RN Outcome: Progressing   Problem: Activity: Goal: Risk for activity intolerance will decrease 02/03/2021 1137 by Tanda Rockers, RN Outcome: Adequate for Discharge 02/03/2021 1137 by Tanda Rockers, RN Outcome: Progressing   Problem: Nutrition: Goal: Adequate nutrition will be maintained 02/03/2021 1137 by Tanda Rockers, RN Outcome: Adequate for Discharge 02/03/2021 1137 by  Tanda Rockers, RN Outcome: Progressing   Problem: Coping: Goal: Level of anxiety will decrease 02/03/2021 1137 by Tanda Rockers, RN Outcome: Adequate for Discharge 02/03/2021 1137 by Tanda Rockers, RN Outcome: Progressing   Problem: Elimination: Goal: Will not experience complications related to bowel motility 02/03/2021 1137 by Tanda Rockers, RN Outcome: Adequate for Discharge 02/03/2021 1137 by Tanda Rockers, RN Outcome: Progressing Goal: Will not experience complications related to urinary retention 02/03/2021 1137 by Tanda Rockers, RN Outcome: Adequate for Discharge 02/03/2021 1137 by Tanda Rockers, RN Outcome: Progressing   Problem: Pain Managment: Goal: General experience of comfort will improve 02/03/2021 1137 by Tanda Rockers, RN Outcome: Adequate for Discharge 02/03/2021 1137 by Tanda Rockers, RN Outcome: Progressing   Problem: Safety: Goal: Ability to remain free from injury will improve 02/03/2021 1137 by Tanda Rockers, RN Outcome: Adequate for Discharge 02/03/2021 1137 by Tanda Rockers, RN Outcome: Progressing   Problem: Skin Integrity: Goal: Risk for impaired skin integrity will decrease 02/03/2021 1137 by Tanda Rockers, RN Outcome: Adequate for Discharge 02/03/2021 1137 by Tanda Rockers, RN Outcome: Progressing   Problem: Education: Goal: Required Educational Video(s) 02/03/2021 1137 by Tanda Rockers, RN Outcome: Adequate for Discharge 02/03/2021 1137 by Tanda Rockers, RN Outcome: Progressing   Problem: Clinical Measurements: Goal: Ability to maintain clinical measurements within normal limits will improve 02/03/2021 1137 by Tanda Rockers, RN Outcome: Adequate for Discharge 02/03/2021 1137 by Tanda Rockers, RN Outcome: Progressing Goal: Postoperative complications will be avoided or minimized 02/03/2021 1137 by Tanda Rockers, RN Outcome: Adequate for Discharge 02/03/2021  1137 by Tanda Rockers, RN Outcome: Progressing   Problem: Skin Integrity: Goal: Demonstration of wound healing without infection will improve 02/03/2021 1137 by Tanda Rockers, RN Outcome: Adequate for Discharge 02/03/2021 1137 by Tanda Rockers, RN Outcome: Progressing

## 2021-02-03 NOTE — Progress Notes (Signed)
3 Days Post-Op   Subjective/Chief Complaint: Feels better today. Having flatus and bm's   Objective: Vital signs in last 24 hours: Temp:  [97.6 F (36.4 C)-98.4 F (36.9 C)] 97.6 F (36.4 C) (01/22 0508) Pulse Rate:  [58-62] 60 (01/22 0508) Resp:  [16-18] 16 (01/22 0508) BP: (112-123)/(66-70) 114/66 (01/22 0508) SpO2:  [95 %-96 %] 95 % (01/22 0508) Weight:  [73.7 kg] 73.7 kg (01/22 0508) Last BM Date: 02/02/21  Intake/Output from previous day: 01/21 0701 - 01/22 0700 In: 780 [P.O.:780] Out: -  Intake/Output this shift: No intake/output data recorded.  General appearance: alert and cooperative Resp: clear to auscultation bilaterally Cardio: regular rate and rhythm GI: soft, minimal tenderness. Incision ok  Lab Results:  Recent Labs    02/02/21 0419 02/03/21 0409  WBC 7.0 4.7  HGB 12.3* 12.3*  HCT 37.0* 37.4*  PLT 143* 148*   BMET Recent Labs    02/02/21 0419 02/03/21 0409  NA 138 139  K 3.5 3.8  CL 105 106  CO2 27 27  GLUCOSE 103* 84  BUN 18 17  CREATININE 0.99 0.86  CALCIUM 8.5* 8.7*   PT/INR No results for input(s): LABPROT, INR in the last 72 hours. ABG No results for input(s): PHART, HCO3 in the last 72 hours.  Invalid input(s): PCO2, PO2  Studies/Results: No results found.  Anti-infectives: Anti-infectives (From admission, onward)    Start     Dose/Rate Route Frequency Ordered Stop   01/31/21 1400  neomycin (MYCIFRADIN) tablet 1,000 mg  Status:  Discontinued       See Hyperspace for full Linked Orders Report.   1,000 mg Oral 3 times per day 01/31/21 1144 01/31/21 1158   01/31/21 1400  metroNIDAZOLE (FLAGYL) tablet 1,000 mg  Status:  Discontinued       See Hyperspace for full Linked Orders Report.   1,000 mg Oral 3 times per day 01/31/21 1144 01/31/21 1158   01/31/21 1144  clindamycin (CLEOCIN) IVPB 900 mg       See Hyperspace for full Linked Orders Report.   900 mg 100 mL/hr over 30 Minutes Intravenous 60 min pre-op 01/31/21 1144  01/31/21 1450   01/31/21 1144  gentamicin (GARAMYCIN) 360 mg in dextrose 5 % 100 mL IVPB       See Hyperspace for full Linked Orders Report.   5 mg/kg  71.2 kg 109 mL/hr over 60 Minutes Intravenous 60 min pre-op 01/31/21 1144 01/31/21 1500       Assessment/Plan: s/p Procedure(s): LAPAROSCOPIC RIGHT HEMI COLECTOMY WITH TAPPS BLOCK (Right) Advance diet Discharge  LOS: 3 days    Autumn Messing III 02/03/2021

## 2021-02-03 NOTE — Progress Notes (Signed)
Assessment unchanged. Pt verbalized understanding of dc instructions through teach back. Discharged via wc to front entrance accompanied by NT.

## 2021-02-04 LAB — SURGICAL PATHOLOGY

## 2021-02-06 NOTE — Discharge Summary (Addendum)
Patient ID: Chad Ford MRN: [id number] DOB/AGE: 05/10/1940 81 y.o.  Admit date: 01/31/2021 Discharge date: 02/03/2021  Discharge Diagnoses Patient Active Problem List   Diagnosis Date Noted   S/P right hemicolectomy 01/31/2021   Appendicitis 09/03/2020   Malignant neoplasm of prostate ([place]) 08/26/2019    Consultants None  Procedures OR 01/31/21 - Laparoscopic right hemicolectomy, bilateral TAP blocks  Hospital Course: He was admitted postoperatively where he recovered uneventfully.  His diet was gradually advanced.  He tolerated the soft diet, and began having spontaneous bowel function.  On 02/03/2021, after being seen by my partner, Dr. Marlou Starks, he was deemed stable for discharge home.  Discharge instructions as well as expectations had been reviewed with him.  Follow-up in our office has been arranged.  Final pathology demonstrated benign colon without any evident carcinoma identified in 0/26 lymph nodes.    Allergies as of 02/03/2021       Reactions   Penicillins Hives, Swelling   SWELLING REACTION UNSPECIFIED, FROM CHILDHOOD. Has patient had a PCN reaction causing immediate rash, facial/tongue/throat swelling, SOB or lightheadedness with hypotension: Unknown Has patient had a PCN reaction causing severe rash involving mucus membranes or skin necrosis: No Has patient had a PCN reaction that required hospitalization: No Has patient had a PCN reaction occurring within the last 10 years: No If all of the above answers are "NO", then may proceed with Cephalosporin use.        Medication List     STOP taking these medications    acetaminophen 500 MG tablet Commonly known as: TYLENOL       TAKE these medications    amLODipine 5 MG tablet Commonly known as: NORVASC Take 5 mg by mouth daily.   benazepril 40 MG tablet Commonly known as: LOTENSIN Take 40 mg by mouth at bedtime.   ciclopirox 0.77 % cream Commonly known as: LOPROX Apply 1 application topically See  admin instructions. Apply to feet every other nite   doxazosin 8 MG tablet Commonly known as: CARDURA Take 8 mg by mouth at bedtime.   naproxen sodium 220 MG tablet Commonly known as: ALEVE Take 220 mg by mouth 2 (two) times daily as needed (pain/headache/fever).   pravastatin 40 MG tablet Commonly known as: PRAVACHOL Take 40 mg by mouth at bedtime.   traMADol 50 MG tablet Commonly known as: Ultram Take 1 tablet (50 mg total) by mouth every 6 (six) hours as needed for up to 5 days (postop pain not controlled with tylenol and ibuprofen first).          Follow-up Information     Ileana Roup, MD Follow up in 2 week(s).   Specialties: General Surgery, Colon and Rectal Surgery Contact information: [address] [place] [phone] [place] Dema Severin, M.D. [facility]

## 2021-02-14 ENCOUNTER — Ambulatory Visit (INDEPENDENT_AMBULATORY_CARE_PROVIDER_SITE_OTHER): Payer: PPO | Admitting: Dermatology

## 2021-02-14 ENCOUNTER — Encounter: Payer: Self-pay | Admitting: Dermatology

## 2021-02-14 ENCOUNTER — Other Ambulatory Visit: Payer: Self-pay

## 2021-02-14 DIAGNOSIS — D044 Carcinoma in situ of skin of scalp and neck: Secondary | ICD-10-CM | POA: Diagnosis not present

## 2021-02-14 DIAGNOSIS — C44619 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, including shoulder: Secondary | ICD-10-CM

## 2021-02-14 DIAGNOSIS — D485 Neoplasm of uncertain behavior of skin: Secondary | ICD-10-CM

## 2021-02-14 NOTE — Patient Instructions (Signed)

## 2021-02-18 ENCOUNTER — Telehealth: Payer: Self-pay | Admitting: Hematology

## 2021-02-18 NOTE — Telephone Encounter (Signed)
Scheduled appt per 2/6 referral. Pt is aware of appt date and time. Pt is aware to arrive 15 mins prior to appt time.  °

## 2021-02-27 DIAGNOSIS — C181 Malignant neoplasm of appendix: Secondary | ICD-10-CM | POA: Insufficient documentation

## 2021-02-28 ENCOUNTER — Inpatient Hospital Stay: Payer: PPO | Attending: Hematology | Admitting: Hematology

## 2021-02-28 ENCOUNTER — Other Ambulatory Visit: Payer: Self-pay

## 2021-02-28 ENCOUNTER — Encounter: Payer: Self-pay | Admitting: Hematology

## 2021-02-28 ENCOUNTER — Ambulatory Visit: Payer: PPO | Admitting: Dermatology

## 2021-02-28 DIAGNOSIS — Z923 Personal history of irradiation: Secondary | ICD-10-CM | POA: Diagnosis not present

## 2021-02-28 DIAGNOSIS — C181 Malignant neoplasm of appendix: Secondary | ICD-10-CM | POA: Insufficient documentation

## 2021-02-28 DIAGNOSIS — Z8546 Personal history of malignant neoplasm of prostate: Secondary | ICD-10-CM | POA: Diagnosis not present

## 2021-02-28 DIAGNOSIS — Z79899 Other long term (current) drug therapy: Secondary | ICD-10-CM | POA: Insufficient documentation

## 2021-02-28 DIAGNOSIS — Z85828 Personal history of other malignant neoplasm of skin: Secondary | ICD-10-CM | POA: Diagnosis not present

## 2021-02-28 NOTE — Progress Notes (Signed)
[place]   [phone] Fax:([phone]) [place] Note   Patient Care Team: Chad Ford Ford as PCP - General (Family Medicine) Chad Ford Napoleon, PA-C (Inactive) (Dermatology)  Date of Service:  02/28/2021   CHIEF COMPLAINTS/PURPOSE OF CONSULTATION:  Appendiceal Adenocarcinoma  REFERRING PHYSICIAN:  Dr. Dema Ford  ASSESSMENT & PLAN:  Chad Ford Ford is a 81 y.o.  male with a history of prostate cancer  1. Primary appendiceal adenocarcinoma, pT4N0M0, Ford II  -presented to urgent care on 09/03/20 with abdominal pain, bloating, and distention. CT AP revealed acute appendicitis. Appendectomy the next day under Chad Ford Ford showed incidental adenocarcinoma with neuroendocrine features, moderately differentiated. -work up with chest CT, colonoscopy, and abdomen MRI were overall negative. He had right hemicolectomy on 01/31/21 with Dr. Dema Ford, pathology from colon and all 26 lymph nodes was negative. -I reviewed the work up and findings with them today. I explained that his cancer is grade 2, indicating moderate risk. Given this, his age, and the amount of time it's been since his initial surgery, I do not recommend adjuvant chemotherapy. Given his moderate risk of recurrence, we will monitor him with scans. -I will see him back in 4 months, plan for scan at that time.  2. H/o prostate cancer  -diagnosed 08/2019, s/p brachytherapy on 10/28/19 under Dr. Abner Ford and Dr. Tammi Ford -PSA f/u with Dr. Abner Ford.   PLAN:  -f/u in 4 months, with lab and CT several days before   Oncology History  Malignant neoplasm of prostate ([place])  07/22/2019 Cancer Staging   Staging form: Prostate, AJCC 8th Edition - Clinical Ford from 07/22/2019: Ford IIC (cT1c, cN0, cM0, PSA: 11.5, Grade Group: 3) - Signed by Chad Ford Caldron, PA-C on 08/26/2019    08/26/2019 Initial Diagnosis   Malignant neoplasm of prostate [facility])   Primary appendiceal adenocarcinoma ([place])   09/03/2020 Imaging   EXAM: CT ABDOMEN AND PELVIS WITH CONTRAST  IMPRESSION: 1. Acute appendicitis. No abscess or perforation. 2. Sigmoid diverticulosis. 3. Indeterminate 5 mm low attenuating/cystic lesion involving the posterior aspect of the uncinate process of the pancreas. This can be better characterized with MRI on a nonemergent/outpatient basis. 4. Aortic Atherosclerosis (ICD10-I70.0).   09/04/2020 Pathology Results   FINAL MICROSCOPIC DIAGNOSIS:   A. APPENDIX, APPENDECTOMY:  -  Adenocarcinoma with neuroendocrine features, moderately  differentiated  -  Proximal margin uninvolved by adenocarcinoma  -  See oncology table below    10/02/2020 Imaging   EXAM: CT CHEST WITH CONTRAST  IMPRESSION: Small sliding-type hiatal hernia.   No other significant abnormality seen in the chest.   12/21/2020 Imaging   EXAM: MRI ABDOMEN WITHOUT AND WITH CONTRAST  IMPRESSION: 1. 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 The largest of these measures up to 1.3 x 0.8 cm in the posterior aspect of the pancreatic head. Repeat abdominal MRI with and without IV gadolinium with MRCP is recommended in 2 years to ensure stability of these findings. This recommendation follows ACR consensus guidelines: Management of Incidental Pancreatic Cysts: A White Paper of the ACR Incidental Findings Committee. J Am Coll Radiol 8127;51:700-174. 2. Mild left-sided hydronephrosis, suggestive of potential left UPJ obstruction. Further clinical evaluation is recommended. 3. Aortic atherosclerosis. 4. Additional incidental findings, as above.   01/31/2021 Pathology Results   FINAL MICROSCOPIC DIAGNOSIS:   A. COLON, RIGHT, HEMI-COLECTOMY:  -  Benign colon  -  No carcinoma identified in twenty-six lymph nodes(0/26)  02/27/2021 Initial Diagnosis   Primary appendiceal adenocarcinoma (HCC)      HISTORY OF  PRESENTING ILLNESS:  Chad Ford Ford 81 y.o. male is a here because of appendiceal cancer. The patient was referred by Dr. Dema Ford. The patient presents to the clinic today accompanied by his wife.   He initially presented to urgent care on 09/03/20 with abdominal pain, bloating, and distention. There was some concern on EKG, so he was referred to the ED that day. CT AP at that time revealed acute appendicitis. He proceeded to emergent appendectomy under Chad Ford Ford. Unfortunately, pathology from the procedure revealed adenocarcinoma with neuroendocrine features.  He underwent further work up with chest CT on 10/02/20, colonoscopy under Dr. Benson Ford on 10/30/20, EKG and cardiology work up, and abdomen MRI on 12/21/20. He was referred to Dr. Dema Ford and ultimately underwent right hemicolectomy on 01/31/21. Pathology from the procedure was benign, including all 26 lymph nodes (0/26).   Today the patient notes they felt/feeling prior/after... -he denies any concerns with how he feels  He has a PMHx of.... -prostate cancer 08/2019, s/p brachytherapy seed implant -non-melanoma skin cancer   Socially... -he enjoys doing yard work.   REVIEW OF SYSTEMS:    Constitutional: Denies fevers, chills or abnormal night sweats Eyes: Denies blurriness of vision, double vision or watery eyes Ears, nose, mouth, throat, and face: Denies mucositis or sore throat Respiratory: Denies cough, dyspnea or wheezes Cardiovascular: Denies palpitation, chest discomfort or lower extremity swelling Gastrointestinal:  Denies nausea, heartburn or change in bowel habits Skin: Denies abnormal skin rashes Lymphatics: Denies new lymphadenopathy or easy bruising Neurological:Denies numbness, tingling or new weaknesses Behavioral/Psych: Mood is stable, no new changes  All other systems were reviewed with the patient and are negative.   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   Left Upper Forehead (excision)   Prostate cancer ([place])    Psoriasis    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   Right Forearm (Keratoacanthoma) (curet and 5FU)   Superficial basal cell carcinoma (BCC) 06/16/2019   Left Upper Arm - Anterior   Vertigo    treats with specific exercises    SURGICAL HISTORY: Past Surgical History:  Procedure Laterality Date   APPENDECTOMY  09/04/2020   CIRCUMCISION     as a child   COLON SURGERY  01/31/2021   COLONOSCOPY     COLONOSCOPY W/ POLYPECTOMY     HEMORRHOID SURGERY  2000   HERNIA REPAIR Right 2002   Inguinal   INGUINAL HERNIA REPAIR Left 12/31/2015   Procedure: HERNIA REPAIR INGUINAL ADULT;  Surgeon: Georganna Skeans, MD;  Location: [place];  Service: General;  Laterality: Left;   INSERTION OF MESH Left 12/31/2015   Procedure: INSERTION OF MESH;  Surgeon: Georganna Skeans, MD;  Location: [place];  Service: General;  Laterality: Left;   LAPAROSCOPIC APPENDECTOMY N/A 09/04/2020   Procedure: APPENDECTOMY LAPAROSCOPIC;  Surgeon:  Erroll Luna, MD;  Location: [facility];  Service: General;  Laterality: N/A;   LAPAROSCOPIC RIGHT HEMI COLECTOMY Right 01/31/2021   Procedure: LAPAROSCOPIC RIGHT HEMI COLECTOMY WITH TAPPS BLOCK;  Surgeon: Ileana Roup, MD;  Location: [facility];  Service: General;  Laterality: Right;   RADIOACTIVE SEED IMPLANT N/A 10/28/2019   Procedure: RADIOACTIVE SEED IMPLANT/BRACHYTHERAPY IMPLANT;  Surgeon: Janith Lima, MD;  [facility];  Service: Urology;   Laterality: N/A;   SPACE OAR INSTILLATION N/A 10/28/2019   Procedure: SPACE OAR INSTILLATION;  Surgeon: Janith Lima, MD;  Location: [facility];  Service: Urology;  Laterality: N/A;   TONSILLECTOMY     as a child    SOCIAL HISTORY: Social History   Socioeconomic History   Marital status: Married    Spouse name: Not on file   Number of children: 3   Years of education: 16   Highest education level: Not on file  Occupational History   Not on file  Tobacco Use   Smoking status: Never   Smokeless tobacco: Never  Vaping Use   Vaping Use: Never used  Substance and Sexual Activity   Alcohol use: No   Drug use: No   Sexual activity: Not Currently  Other Topics Concern   Not on file  Social History Narrative   Not on file   Social Determinants of Health   Financial Resource Strain: Not on file  Food Insecurity: Not on file  Transportation Needs: Not on file  Physical Activity: Not on file  Stress: Not on file  Social Connections: Not on file  Intimate Partner Violence: Not on file    FAMILY HISTORY: Family History  Problem Relation Age of Onset   Hypertension Mother    Hypertension Father     ALLERGIES:  is allergic to penicillins.  MEDICATIONS:  Current Outpatient Medications  Medication Sig Dispense Refill   amLODipine (NORVASC) 5 MG tablet Take 5 mg by mouth daily.     benazepril (LOTENSIN) 40 MG tablet Take 40 mg by mouth at bedtime.     ciclopirox (LOPROX) 0.77 % cream Apply 1 application topically See admin instructions. Apply to feet every other nite 90 g 2   doxazosin (CARDURA) 8 MG tablet Take 8 mg by mouth at bedtime.     naproxen sodium (ALEVE) 220 MG tablet Take 220 mg by mouth 2 (two) times daily as needed (pain/headache/fever).     pravastatin (PRAVACHOL) 40 MG tablet Take 40 mg by mouth at bedtime.     No current facility-administered medications for this visit.    PHYSICAL EXAMINATION: ECOG PERFORMANCE STATUS: 1 - Symptomatic  but completely ambulatory  Vitals:   02/28/21 1511  BP: 135/65  Pulse: 64  Resp: 18  Temp: 98.5 F (36.9 C)  SpO2: 99%   Filed Weights   02/28/21 1511  Weight: 156 lb 6.4 oz (70.9 kg)    GENERAL:alert, no distress and comfortable SKIN: skin color, texture, turgor are normal, no rashes or significant lesions EYES: normal, Conjunctiva are pink and non-injected, sclera clear  NECK: supple, thyroid normal size, non-tender, without nodularity LYMPH:  no palpable lymphadenopathy in the cervical, axillary  LUNGS: clear to auscultation and percussion with normal breathing effort HEART: regular rate & rhythm and no murmurs and no lower extremity edema ABDOMEN:abdomen soft, non-tender and normal bowel sounds Musculoskeletal:no cyanosis of digits and no clubbing  NEURO: alert & oriented x 3 with fluent speech, no focal motor/sensory deficits  LABORATORY  DATA:  I have reviewed the data as listed CBC Latest Ref Rng & Units 02/03/2021 02/02/2021 02/01/2021  WBC 4.0 - 10.5 K/uL 4.7 7.0 7.7  Hemoglobin 13.0 - 17.0 g/dL 12.3(L) 12.3(L) 13.2  Hematocrit 39.0 - 52.0 % 37.4(L) 37.0(L) 39.6  Platelets 150 - 400 K/uL 148(L) 143(L) 154    CMP Latest Ref Rng & Units 02/03/2021 02/02/2021 02/01/2021  Glucose 70 - 99 mg/dL 84 103(H) 162(H)  BUN 8 - 23 mg/dL 17 18 10   Creatinine 0.61 - 1.24 mg/dL 0.86 0.99 0.98  Sodium 135 - 145 mmol/L 139 138 141  Potassium 3.5 - 5.1 mmol/L 3.8 3.5 3.7  Chloride 98 - 111 mmol/L 106 105 99  CO2 22 - 32 mmol/L 27 27 24   Calcium 8.9 - 10.3 mg/dL 8.7(L) 8.5(L) 8.2(L)  Total Protein 6.5 - 8.1 g/dL - - -  Total Bilirubin 0.3 - 1.2 mg/dL - - -  Alkaline Phos 38 - 126 U/L - - -  AST 15 - 41 U/L - - -  ALT 0 - 44 U/L - - -     RADIOGRAPHIC STUDIES: I have personally reviewed the radiological images as listed and agreed with the findings in the report. No results found.   Orders Placed This Encounter  Procedures   CT ABDOMEN PELVIS W CONTRAST    Standing Status:    Future    Standing Expiration Date:   02/28/2022    Order Specific Question:   If indicated for the ordered procedure, I authorize the administration of contrast media per Radiology protocol    Answer:   Yes    Order Specific Question:   Preferred imaging location?    Answer:   [facility]    Order Specific Question:   Is Oral Contrast requested for this exam?    Answer:   Yes, Per Radiology protocol    All questions were answered. The patient knows to call the clinic with any problems, questions or concerns. The total time spent in the appointment was 45 minutes.     Truitt Merle, MD 02/28/2021   I, Wilburn Mylar, am acting as scribe for Truitt Merle, MD.   I have reviewed the above documentation for accuracy and completeness, and I agree with the above.

## 2021-03-04 ENCOUNTER — Telehealth: Payer: Self-pay | Admitting: Hematology

## 2021-03-04 NOTE — Telephone Encounter (Signed)
Scheduled follow-up appointments per 2/16 los. Patient is aware. 

## 2021-03-09 ENCOUNTER — Encounter: Payer: Self-pay | Admitting: Dermatology

## 2021-03-09 NOTE — Progress Notes (Signed)
Follow-Up Visit   Subjective  Chad Ford is a 81 y.o. male who presents for the following: Procedure (Patient here today for treatment of CIS x 3 left temple, mid parietal scalp and right mid parietal scalp.).  Biopsy-proven skin cancers face and scalp plus check spot on left arm Location:  Duration:  Quality:  Associated Signs/Symptoms: Modifying Factors:  Severity:  Timing: Context:   Objective  Well appearing patient in no apparent distress; mood and affect are within normal limits. Mid Parietal Scalp Biopsy site identified by nurse and me  Right Mid Parietal Scalp Biopsy site identified by nurse and me  Left Upper Arm - Anterior 1 cm flat pearly nodule         All skin waist up examined.   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  Destruction of lesion Complexity: simple   Destruction method: electrodesiccation and curettage   Informed consent: discussed and consent obtained   Timeout:  patient name, date of birth, surgical site, and procedure verified Anesthesia: the lesion was anesthetized in a standard fashion   Anesthetic:  1% lidocaine w/ epinephrine 1-100,000 local infiltration Curettage performed in three different directions: Yes   Electrodesiccation performed over the curetted area: Yes   Curettage cycles:  3 Lesion length (cm):  1.1 Lesion width (cm):  1.1 Margin per side (cm):  0 Final wound size (cm):  1.1 Hemostasis achieved with:  ferric subsulfate Outcome: patient tolerated procedure well with no complications   Post-procedure details: sterile dressing applied and wound care instructions given   Dressing type: bandage and petrolatum   Additional details:  Wound inoculated with 5% Fluorouracil.    Right Mid Parietal Scalp  Destruction of lesion Complexity: simple   Destruction method: electrodesiccation and curettage   Informed consent: discussed and consent obtained    Timeout:  patient name, date of birth, surgical site, and procedure verified Anesthesia: the lesion was anesthetized in a standard fashion   Anesthetic:  1% lidocaine w/ epinephrine 1-100,000 local infiltration Curettage performed in three different directions: Yes     Electrodesiccation performed over the curetted area: No   Curettage cycles:  3 Lesion length (cm):  1.2 Lesion width (cm):  1.2 Margin per side (cm):  0 Final wound size (cm):  1.2 Hemostasis achieved with:  ferric subsulfate Outcome: patient tolerated procedure well with no complications   Post-procedure details: sterile dressing applied and wound care instructions given   Dressing type: bandage and petrolatum   Additional details:  Wound inoculated with 5% Fluorouracil.    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, including shoulder Left Upper Arm - Anterior  Skin / nail biopsy Type of biopsy: tangential   Informed consent: discussed and consent obtained   Timeout: patient name, date of birth, surgical site, and procedure verified   Anesthesia: the lesion was anesthetized in a standard fashion   Anesthetic:  1% lidocaine w/ epinephrine 1-100,000 local infiltration Instrument used: flexible razor blade   Hemostasis achieved with: aluminum chloride and electrodesiccation   Outcome: patient tolerated procedure well   Post-procedure details: wound care instructions given    Destruction of lesion Complexity: simple   Destruction method: electrodesiccation and curettage   Informed consent: discussed and consent obtained   Timeout:  patient name, date of birth, surgical site, and procedure verified Anesthesia: the lesion was anesthetized in a standard fashion   Anesthetic:  1% lidocaine w/ epinephrine 1-100,000  local infiltration Curettage performed in three different directions: Yes     Electrodesiccation performed over the curetted area: No   Curettage cycles:  3 Lesion length (cm):  1.5 Lesion width  (cm):  1.5 Margin per side (cm):  0 Final wound size (cm):  1.5 Hemostasis achieved with:  ferric subsulfate Outcome: patient tolerated procedure well with no complications   Post-procedure details: sterile dressing applied and wound care instructions given   Dressing type: bandage and petrolatum   Additional details:  Wound inoculated with 5% Fluorouracil.    Specimen 1 - Surgical pathology Differential Diagnosis: R/O SCC VS BCC - TXPBX  Check Margins: No      I, Lavonna Monarch, MD, have reviewed all documentation for this visit.  The documentation on 03/09/21 for the exam, diagnosis, procedures, and orders are all accurate and complete.

## 2021-06-27 ENCOUNTER — Other Ambulatory Visit: Payer: Self-pay

## 2021-06-27 DIAGNOSIS — C181 Malignant neoplasm of appendix: Secondary | ICD-10-CM

## 2021-06-28 ENCOUNTER — Other Ambulatory Visit: Payer: Self-pay

## 2021-06-28 ENCOUNTER — Inpatient Hospital Stay: Payer: PPO | Attending: Hematology

## 2021-06-28 ENCOUNTER — Ambulatory Visit (HOSPITAL_COMMUNITY)
Admission: RE | Admit: 2021-06-28 | Discharge: 2021-06-28 | Disposition: A | Discharge status: Home / Self Care | Payer: PPO | Source: Ambulatory Visit | Attending: Hematology | Admitting: Hematology

## 2021-06-28 DIAGNOSIS — Z79899 Other long term (current) drug therapy: Secondary | ICD-10-CM | POA: Insufficient documentation

## 2021-06-28 DIAGNOSIS — C181 Malignant neoplasm of appendix: Secondary | ICD-10-CM | POA: Diagnosis present

## 2021-06-28 DIAGNOSIS — Z8546 Personal history of malignant neoplasm of prostate: Secondary | ICD-10-CM | POA: Insufficient documentation

## 2021-06-28 DIAGNOSIS — Z85038 Personal history of other malignant neoplasm of large intestine: Secondary | ICD-10-CM | POA: Insufficient documentation

## 2021-06-28 LAB — CBC WITH DIFFERENTIAL (CANCER CENTER ONLY)
Abs Immature Granulocytes: 0.01 10*3/uL (ref 0.00–0.07)
Basophils Absolute: 0 10*3/uL (ref 0.0–0.1)
Basophils Relative: 0 %
Eosinophils Absolute: 0.1 10*3/uL (ref 0.0–0.5)
Eosinophils Relative: 2 %
HCT: 41.3 % (ref 39.0–52.0)
Hemoglobin: 14 g/dL (ref 13.0–17.0)
Immature Granulocytes: 0 %
Lymphocytes Relative: 11 %
Lymphs Abs: 0.6 10*3/uL — ABNORMAL LOW (ref 0.7–4.0)
MCH: 31.3 pg (ref 26.0–34.0)
MCHC: 33.9 g/dL (ref 30.0–36.0)
MCV: 92.4 fL (ref 80.0–100.0)
Monocytes Absolute: 0.4 10*3/uL (ref 0.1–1.0)
Monocytes Relative: 8 %
Neutro Abs: 4 10*3/uL (ref 1.7–7.7)
Neutrophils Relative %: 79 %
Platelet Count: 168 10*3/uL (ref 150–400)
RBC: 4.47 MIL/uL (ref 4.22–5.81)
RDW: 13.2 % (ref 11.5–15.5)
WBC Count: 5 10*3/uL (ref 4.0–10.5)
nRBC: 0 % (ref 0.0–0.2)

## 2021-06-28 LAB — CMP (CANCER CENTER ONLY)
ALT: 14 U/L (ref 0–44)
AST: 18 U/L (ref 15–41)
Albumin: 4.1 g/dL (ref 3.5–5.0)
Alkaline Phosphatase: 81 U/L (ref 38–126)
Anion gap: 7 (ref 5–15)
BUN: 17 mg/dL (ref 8–23)
CO2: 26 mmol/L (ref 22–32)
Calcium: 9.2 mg/dL (ref 8.9–10.3)
Chloride: 107 mmol/L (ref 98–111)
Creatinine: 0.99 mg/dL (ref 0.61–1.24)
GFR, Estimated: >60 mL/min (ref >60)
Glucose, Bld: 119 mg/dL — ABNORMAL HIGH (ref 70–99)
Potassium: 4 mmol/L (ref 3.5–5.1)
Sodium: 140 mmol/L (ref 135–145)
Total Bilirubin: 0.7 mg/dL (ref 0.3–1.2)
Total Protein: 7.1 g/dL (ref 6.5–8.1)

## 2021-06-28 MED ORDER — IOHEXOL 300 MG/ML  SOLN
100.0000 mL | Freq: Once | INTRAMUSCULAR | Status: AC | PRN
  Administered 2021-06-28: 100 mL via INTRAVENOUS

## 2021-07-02 ENCOUNTER — Inpatient Hospital Stay: Payer: PPO | Admitting: Hematology

## 2021-07-02 ENCOUNTER — Other Ambulatory Visit: Payer: Self-pay

## 2021-07-02 ENCOUNTER — Encounter: Payer: Self-pay | Admitting: Hematology

## 2021-07-02 VITALS — BP 136/81 | HR 77 | Temp 98.0°F | Resp 16 | Ht 68.5 in | Wt 159.5 lb

## 2021-07-02 DIAGNOSIS — Z8546 Personal history of malignant neoplasm of prostate: Secondary | ICD-10-CM | POA: Diagnosis present

## 2021-07-02 DIAGNOSIS — C181 Malignant neoplasm of appendix: Secondary | ICD-10-CM | POA: Diagnosis not present

## 2021-07-02 DIAGNOSIS — Z79899 Other long term (current) drug therapy: Secondary | ICD-10-CM | POA: Diagnosis not present

## 2021-07-02 DIAGNOSIS — Z85038 Personal history of other malignant neoplasm of large intestine: Secondary | ICD-10-CM | POA: Diagnosis present

## 2021-07-02 NOTE — Progress Notes (Signed)
[place]   Telephone:(336) [phone] Fax:(336) [phone]   Clinic Follow up Note   Patient Care Team: Amie Critchley as PCP - General (Family Medicine) Silvestre Gunner, Anderson Malta, PA-C (Inactive) (Dermatology)  Date of Service:  07/02/2021  CHIEF COMPLAINT: f/u of appendiceal cancer  CURRENT THERAPY:  Surveillance  ASSESSMENT & PLAN:  Chad Ford is a 81 y.o. male with   1. Primary appendiceal adenocarcinoma, pT4N0M0, stage II  -presented to urgent care on 09/03/20 with abdominal pain, bloating, and distention. CT AP revealed acute appendicitis. Appendectomy the next day under Dr. Brantley Stage showed incidental adenocarcinoma with neuroendocrine features, moderately differentiated. -work up with chest CT, colonoscopy, and abdomen MRI were overall negative. He had right hemicolectomy on 01/31/21 with Dr. Dema Severin, pathology from colon and all 26 lymph nodes was negative. -Given his age, and the amount of time since initial surgery, I do not recommend adjuvant chemotherapy. Given his moderate risk of recurrence, we will monitor him with scans. -restaging CT AP 06/28/21 showed NED. I reviewed the results with them today, including the incidental findings  -I will see him back in 3 months, plan to repeat CT again in 6 months due to his high risk of recurrence    2. H/o prostate cancer  -diagnosed 08/2019, s/p brachytherapy on 10/28/19 under Dr. Abner Greenspan and Dr. Tammi Klippel -PSA f/u with Dr. Abner Greenspan, low per pt.     PLAN:  -lab and f/u in 3 months -he will f/u with Dr. Dema Severin on 6/30    No problem-specific Assessment & Plan notes found for this encounter.   SUMMARY OF ONCOLOGIC HISTORY: Oncology History  Malignant neoplasm of prostate ([place])  07/22/2019 Cancer Staging   Staging form: Prostate, AJCC 8th Edition - Clinical stage from 07/22/2019: Stage IIC (cT1c, cN0, cM0, PSA: 11.5, Grade Group: 3) - Signed by Freeman Caldron, PA-C on 08/26/2019   08/26/2019 Initial Diagnosis    Malignant neoplasm of prostate [facility])   Primary appendiceal adenocarcinoma ([place])  09/03/2020 Imaging   EXAM: CT ABDOMEN AND PELVIS WITH CONTRAST  IMPRESSION: 1. Acute appendicitis. No abscess or perforation. 2. Sigmoid diverticulosis. 3. Indeterminate 5 mm low attenuating/cystic lesion involving the posterior aspect of the uncinate process of the pancreas. This can be better characterized with MRI on a nonemergent/outpatient basis. 4. Aortic Atherosclerosis (ICD10-I70.0).   09/04/2020 Pathology Results   FINAL MICROSCOPIC DIAGNOSIS:   A. APPENDIX, APPENDECTOMY:  -  Adenocarcinoma with neuroendocrine features, moderately  differentiated  -  Proximal margin uninvolved by adenocarcinoma  -  See oncology table below    10/02/2020 Imaging   EXAM: CT CHEST WITH CONTRAST  IMPRESSION: Small sliding-type hiatal hernia.   No other significant abnormality seen in the chest.   12/21/2020 Imaging   EXAM: MRI ABDOMEN WITHOUT AND WITH CONTRAST  IMPRESSION: 1. 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 The largest of these measures up to 1.3 x 0.8 cm in the posterior aspect of the pancreatic head. Repeat abdominal MRI with and without IV gadolinium with MRCP is recommended in 2 years to ensure stability of these findings. This recommendation follows ACR consensus guidelines: Management of Incidental Pancreatic Cysts: A White Paper of the ACR Incidental Findings Committee. J Am Coll Radiol 2952;84:132-440. 2. Mild left-sided hydronephrosis, suggestive of potential left UPJ obstruction. Further clinical evaluation is recommended. 3. Aortic atherosclerosis. 4. Additional incidental findings, as above.   01/31/2021 Pathology Results   FINAL MICROSCOPIC DIAGNOSIS:  A. COLON, RIGHT, HEMI-COLECTOMY:  -  Benign colon  -  No carcinoma identified in twenty-six lymph nodes(0/26)    02/27/2021  Initial Diagnosis   Primary appendiceal adenocarcinoma (HCC)      INTERVAL HISTORY:  Chad Ford is here for a follow up of appendiceal cancer. He was last seen by me on 02/28/21 in consultation. He presents to the clinic accompanied by his wife. He reports he is doing well overall, no new concerns.   All other systems were reviewed with the patient and are negative.  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/06/2017   Left Upper Forehead (excision)   [place]    Psoriasis    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   Right Forearm (Keratoacanthoma) (curet and 5FU)   Superficial basal cell carcinoma (BCC) 06/16/2019   Left Upper Arm - Anterior   Vertigo    treats with specific exercises    SURGICAL HISTORY: Past Surgical History:  Procedure Laterality Date   APPENDECTOMY  09/04/2020   CIRCUMCISION     as a child   COLON SURGERY  01/31/2021   COLONOSCOPY     COLONOSCOPY W/ POLYPECTOMY     HEMORRHOID SURGERY  2000   HERNIA REPAIR Right 2002   Inguinal   INGUINAL HERNIA REPAIR Left 12/31/2015   Procedure: HERNIA REPAIR INGUINAL ADULT;  Surgeon: BGeorganna Skeans MD;  [place]  Service: General;  Laterality: Left;   INSERTION OF MESH Left 12/31/2015   Procedure: INSERTION OF MESH;  Surgeon: BGeorganna Skeans MD;  [place]  Service: General;  Laterality: Left;   LAPAROSCOPIC APPENDECTOMY N/A 09/04/2020   Procedure: APPENDECTOMY LAPAROSCOPIC;  Surgeon:  CErroll Luna MD;  [place]  Service: General;  Laterality: N/A;   LAPAROSCOPIC RIGHT HEMI COLECTOMY Right 01/31/2021   Procedure: LAPAROSCOPIC RIGHT HEMI COLECTOMY WITH TAPPS BLOCK;  Surgeon: WIleana Roup MD;  [facility];  Service: General;  Laterality: Right;   RADIOACTIVE SEED IMPLANT N/A 10/28/2019   Procedure: RADIOACTIVE SEED IMPLANT/BRACHYTHERAPY IMPLANT;  Surgeon: GJanith Lima MD;  [facility]  Service: Urology;  Laterality: N/A;   SPACE OAR INSTILLATION N/A 10/28/2019   Procedure: SPACE OAR INSTILLATION;  Surgeon: GJanith Lima MD;  [facility]  Service: Urology;  Laterality: N/A;   TONSILLECTOMY     as a child    I have reviewed the social history and family history with the patient and they are unchanged from previous note.  ALLERGIES:  is allergic to penicillins.  MEDICATIONS:  Current Outpatient Medications  Medication Sig Dispense Refill   amLODipine (NORVASC) 5 MG tablet Take 5 mg by mouth daily.     benazepril (LOTENSIN) 40 MG tablet Take 40 mg by mouth at bedtime.     ciclopirox (LOPROX) 0.77 % cream Apply 1 application topically See admin instructions. Apply to  feet every other nite 90 g 2   doxazosin (CARDURA) 8 MG tablet Take 8 mg by mouth at bedtime.     naproxen sodium (ALEVE) 220 MG tablet Take 220 mg by mouth 2 (two) times daily as needed (pain/headache/fever).     pravastatin (PRAVACHOL) 40 MG tablet Take 40 mg by mouth at bedtime.     No current facility-administered medications for this visit.    PHYSICAL EXAMINATION: ECOG PERFORMANCE STATUS: 0 - Asymptomatic  Vitals:   07/02/21 1406  BP: 136/81  Pulse: 77  Resp: 16  Temp: 98 F (36.7 C)  SpO2: 97%   Wt Readings from Last 3 Encounters:  07/02/21 159 lb 8 oz (72.3 kg)  02/28/21 156 lb 6.4 oz (70.9 kg)  02/03/21 162 lb 7.7 oz (73.7 kg)     GENERAL:alert, no distress and comfortable SKIN: skin color normal, no  rashes or significant lesions EYES: normal, Conjunctiva are pink and non-injected, sclera clear  NEURO: alert & oriented x 3 with fluent speech  LABORATORY DATA:  I have reviewed the data as listed    Latest Ref Rng & Units 06/28/2021    3:05 PM 02/03/2021    4:09 AM 02/02/2021    4:19 AM  CBC  WBC 4.0 - 10.5 K/uL 5.0  4.7  7.0   Hemoglobin 13.0 - 17.0 g/dL 14.0  12.3  12.3   Hematocrit 39.0 - 52.0 % 41.3  37.4  37.0   Platelets 150 - 400 K/uL 168  148  143         Latest Ref Rng & Units 06/28/2021    3:05 PM 02/03/2021    4:09 AM 02/02/2021    4:19 AM  CMP  Glucose 70 - 99 mg/dL 119  84  103   BUN 8 - 23 mg/dL '17  17  18   '$ Creatinine 0.61 - 1.24 mg/dL 0.99  0.86  0.99   Sodium 135 - 145 mmol/L 140  139  138   Potassium 3.5 - 5.1 mmol/L 4.0  3.8  3.5   Chloride 98 - 111 mmol/L 107  106  105   CO2 22 - 32 mmol/L '26  27  27   '$ Calcium 8.9 - 10.3 mg/dL 9.2  8.7  8.5   Total Protein 6.5 - 8.1 g/dL 7.1     Total Bilirubin 0.3 - 1.2 mg/dL 0.7     Alkaline Phos 38 - 126 U/L 81     AST 15 - 41 U/L 18     ALT 0 - 44 U/L 14         RADIOGRAPHIC STUDIES: I have personally reviewed the radiological images as listed and agreed with the findings in the report. No results found.    No orders of the defined types were placed in this encounter.  All questions were answered. The patient knows to call the clinic with any problems, questions or concerns. No barriers to learning was detected. The total time spent in the appointment was 30 minutes.     Truitt Merle, MD 07/02/2021   I, Wilburn Mylar, am acting as scribe for Truitt Merle, MD.   I have reviewed the above documentation for accuracy and completeness, and I agree with the above.

## 2021-07-04 ENCOUNTER — Telehealth: Payer: Self-pay | Admitting: Hematology

## 2021-07-04 NOTE — Telephone Encounter (Signed)
Scheduled follow-up appointment per 6/20 los. Patient's wife is aware.

## 2021-08-14 ENCOUNTER — Ambulatory Visit: Payer: PPO | Admitting: Dermatology

## 2021-08-20 ENCOUNTER — Ambulatory Visit: Payer: PPO | Admitting: Dermatology

## 2021-10-01 ENCOUNTER — Other Ambulatory Visit: Payer: Self-pay | Admitting: Hematology

## 2021-10-01 DIAGNOSIS — C181 Malignant neoplasm of appendix: Secondary | ICD-10-CM

## 2021-10-02 ENCOUNTER — Other Ambulatory Visit: Payer: Self-pay

## 2021-10-02 ENCOUNTER — Inpatient Hospital Stay: Payer: PPO

## 2021-10-02 ENCOUNTER — Encounter: Payer: Self-pay | Admitting: Hematology

## 2021-10-02 ENCOUNTER — Inpatient Hospital Stay: Payer: PPO | Attending: Hematology | Admitting: Hematology

## 2021-10-02 VITALS — BP 135/65 | HR 51 | Temp 97.9°F | Resp 16 | Wt 157.7 lb

## 2021-10-02 DIAGNOSIS — C181 Malignant neoplasm of appendix: Secondary | ICD-10-CM | POA: Diagnosis present

## 2021-10-02 DIAGNOSIS — Z8546 Personal history of malignant neoplasm of prostate: Secondary | ICD-10-CM | POA: Diagnosis present

## 2021-10-02 DIAGNOSIS — Z923 Personal history of irradiation: Secondary | ICD-10-CM | POA: Diagnosis not present

## 2021-10-02 LAB — CMP (CANCER CENTER ONLY)
ALT: 17 U/L (ref 0–44)
AST: 19 U/L (ref 15–41)
Albumin: 4 g/dL (ref 3.5–5.0)
Alkaline Phosphatase: 75 U/L (ref 38–126)
Anion gap: 4 — ABNORMAL LOW (ref 5–15)
BUN: 14 mg/dL (ref 8–23)
CO2: 29 mmol/L (ref 22–32)
Calcium: 8.8 mg/dL — ABNORMAL LOW (ref 8.9–10.3)
Chloride: 107 mmol/L (ref 98–111)
Creatinine: 0.91 mg/dL (ref 0.61–1.24)
GFR, Estimated: >60 mL/min (ref >60)
Glucose, Bld: 106 mg/dL — ABNORMAL HIGH (ref 70–99)
Potassium: 4.3 mmol/L (ref 3.5–5.1)
Sodium: 140 mmol/L (ref 135–145)
Total Bilirubin: 0.6 mg/dL (ref 0.3–1.2)
Total Protein: 6.6 g/dL (ref 6.5–8.1)

## 2021-10-02 LAB — CBC WITH DIFFERENTIAL (CANCER CENTER ONLY)
Abs Immature Granulocytes: 0.02 K/uL (ref 0.00–0.07)
Basophils Absolute: 0 K/uL (ref 0.0–0.1)
Basophils Relative: 1 %
Eosinophils Absolute: 0.1 K/uL (ref 0.0–0.5)
Eosinophils Relative: 1 %
HCT: 39.3 % (ref 39.0–52.0)
Hemoglobin: 13.3 g/dL (ref 13.0–17.0)
Immature Granulocytes: 1 %
Lymphocytes Relative: 15 %
Lymphs Abs: 0.6 K/uL — ABNORMAL LOW (ref 0.7–4.0)
MCH: 31.9 pg (ref 26.0–34.0)
MCHC: 33.8 g/dL (ref 30.0–36.0)
MCV: 94.2 fL (ref 80.0–100.0)
Monocytes Absolute: 0.4 K/uL (ref 0.1–1.0)
Monocytes Relative: 10 %
Neutro Abs: 2.9 K/uL (ref 1.7–7.7)
Neutrophils Relative %: 72 %
Platelet Count: 161 K/uL (ref 150–400)
RBC: 4.17 MIL/uL — ABNORMAL LOW (ref 4.22–5.81)
RDW: 12.7 % (ref 11.5–15.5)
WBC Count: 4 K/uL (ref 4.0–10.5)
nRBC: 0 % (ref 0.0–0.2)

## 2021-10-02 LAB — CEA (IN HOUSE-CHCC): CEA (CHCC-In House): 2.24 ng/mL (ref 0.00–5.00)

## 2021-10-02 NOTE — Progress Notes (Signed)
[place]   Telephone:(336) [phone] Fax:(336) [phone]   Clinic Follow up Note   Patient Care Team: Amie Critchley as PCP - General (Family Medicine) Silvestre Gunner, Anderson Malta, PA-C (Inactive) (Dermatology)  Date of Service:  10/02/2021  CHIEF COMPLAINT: f/u of appendiceal cancer  CURRENT THERAPY:  Surveillance  ASSESSMENT & PLAN:  Chad Ford is a 81 y.o. male with   1. Primary appendiceal adenocarcinoma, pT4N0M0, stage II  -presented to urgent care on 09/03/20 with abdominal pain, bloating, and distention. CT AP revealed acute appendicitis. Appendectomy the next day under Dr. Brantley Stage showed incidental adenocarcinoma with neuroendocrine features, moderately differentiated. -work up with chest CT, colonoscopy, and abdomen MRI were overall negative.  -s/p right hemicolectomy on 01/31/21 with Dr. Dema Severin, pathology from colon and all 26 lymph nodes was negative. -he is on cancer surveillance. -restaging CT AP 06/28/21 showed NED.  -he has not undergone any other surveillance measures. I will reach out to Dr. Benson Norway to see if he recommends surveillance colonoscopy. -I will see him back in 3 months after repeat CT   2. H/o prostate cancer  -diagnosed 08/2019, s/p brachytherapy on 10/28/19 under Dr. Abner Greenspan and Dr. Tammi Klippel -PSA f/u with Dr. Abner Greenspan, low per pt.     PLAN:  -I will reach out to Dr. Benson Norway about colonoscopy surveillance.  Given the primary tumor was in appendix and his advanced age, I do not feel strongly he needs a screening colonoscopy. -f/u in 3 months, with lab and CT several days before   No problem-specific Assessment & Plan notes found for this encounter.   SUMMARY OF ONCOLOGIC HISTORY: Oncology History  Malignant neoplasm of prostate ([place])  07/22/2019 Cancer Staging   Staging form: Prostate, AJCC 8th Edition - Clinical stage from 07/22/2019: Stage IIC (cT1c, cN0, cM0, PSA: 11.5, Grade Group: 3) - Signed by Freeman Caldron, PA-C on 08/26/2019    08/26/2019 Initial Diagnosis   Malignant neoplasm of prostate [facility])   Primary appendiceal adenocarcinoma ([place])  09/03/2020 Imaging   EXAM: CT ABDOMEN AND PELVIS WITH CONTRAST  IMPRESSION: 1. Acute appendicitis. No abscess or perforation. 2. Sigmoid diverticulosis. 3. Indeterminate 5 mm low attenuating/cystic lesion involving the posterior aspect of the uncinate process of the pancreas. This can be better characterized with MRI on a nonemergent/outpatient basis. 4. Aortic Atherosclerosis (ICD10-I70.0).   09/04/2020 Pathology Results   FINAL MICROSCOPIC DIAGNOSIS:   A. APPENDIX, APPENDECTOMY:  -  Adenocarcinoma with neuroendocrine features, moderately  differentiated  -  Proximal margin uninvolved by adenocarcinoma  -  See oncology table below    10/02/2020 Imaging   EXAM: CT CHEST WITH CONTRAST  IMPRESSION: Small sliding-type hiatal hernia.   No other significant abnormality seen in the chest.   12/21/2020 Imaging   EXAM: MRI ABDOMEN WITHOUT AND WITH CONTRAST  IMPRESSION: 1. 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 The largest of these measures up to 1.3 x 0.8 cm in the posterior aspect of the pancreatic head. Repeat abdominal MRI with and without IV gadolinium with MRCP is recommended in 2 years to ensure stability of these findings. This recommendation follows ACR consensus guidelines: Management of Incidental Pancreatic Cysts: A White Paper of the ACR Incidental Findings Committee. J Am Coll Radiol 0623;76:283-151. 2. Mild left-sided hydronephrosis, suggestive of potential left UPJ obstruction. Further clinical evaluation is recommended. 3. Aortic atherosclerosis. 4. Additional incidental findings, as above.   01/31/2021 Pathology Results   FINAL MICROSCOPIC  DIAGNOSIS:   A. COLON, RIGHT, HEMI-COLECTOMY:  -  Benign colon  -  No carcinoma identified in twenty-six  lymph nodes(0/26)    02/27/2021 Initial Diagnosis   Primary appendiceal adenocarcinoma (HCC)      INTERVAL HISTORY:  Chad Ford is here for a follow up of appendiceal cancer. He was last seen by me on 07/02/21. He presents to the clinic accompanied by his wife. He reports he is doing well overall. He notes he developed some back spasms from overexertion with sit-ups. He explains this has mostly resolved now. He reports he has a BM once a day, denies hematochezia.   All other systems were reviewed with the patient and are negative.  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   Left Upper Forehead (excision)   [place]    Psoriasis    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   Right Forearm (Keratoacanthoma) (curet and 5FU)   Superficial basal cell carcinoma (BCC) 06/16/2019   Left Upper Arm - Anterior   Vertigo    treats with specific exercises    SURGICAL HISTORY: Past Surgical History:  Procedure Laterality Date   APPENDECTOMY  09/04/2020   CIRCUMCISION     as a child   COLON SURGERY  01/31/2021   COLONOSCOPY     COLONOSCOPY W/ POLYPECTOMY     HEMORRHOID SURGERY  2000   HERNIA REPAIR Right 2002   Inguinal   INGUINAL HERNIA REPAIR Left 12/31/2015   Procedure: HERNIA REPAIR INGUINAL ADULT;  Surgeon: BGeorganna Skeans MD;  [place] [facility]  Service: General;  Laterality: Left;   INSERTION OF MESH Left 12/31/2015   Procedure: INSERTION OF MESH;  Surgeon: BGeorganna Skeans MD;  [place]  Service: General;  Laterality: Left;   LAPAROSCOPIC APPENDECTOMY N/A 09/04/2020   Procedure: APPENDECTOMY LAPAROSCOPIC;  Surgeon: CErroll Luna MD;  [place]  Service: General;  Laterality: N/A;   LAPAROSCOPIC RIGHT HEMI COLECTOMY Right 01/31/2021   Procedure: LAPAROSCOPIC RIGHT HEMI COLECTOMY WITH TAPPS BLOCK;  Surgeon: WIleana Roup MD;  [facility];  Service: General;  Laterality: Right;   RADIOACTIVE SEED IMPLANT N/A 10/28/2019   Procedure: RADIOACTIVE SEED IMPLANT/BRACHYTHERAPY IMPLANT;  Surgeon: GJanith Lima MD;  [facility]  Service: Urology;  Laterality: N/A;   SPACE OAR INSTILLATION N/A 10/28/2019   Procedure: SPACE OAR INSTILLATION;  Surgeon: GJanith Lima MD;  [facility]  Service: Urology;  Laterality: N/A;   TONSILLECTOMY     as a child    I have reviewed the social history and family history with the patient and they are unchanged from previous note.  ALLERGIES:  is allergic to penicillins.  MEDICATIONS:  Current Outpatient Medications  Medication Sig Dispense Refill   amLODipine (NORVASC) 5 MG tablet Take 5 mg by mouth daily.     benazepril (LOTENSIN) 40  MG tablet Take 40 mg by mouth at bedtime.     ciclopirox (LOPROX) 0.77 % cream Apply 1 application topically See admin instructions. Apply to feet every other nite 90 g 2   doxazosin (CARDURA) 8 MG tablet Take 8 mg by mouth at bedtime.     naproxen sodium (ALEVE) 220 MG tablet Take 220 mg by mouth 2 (two) times daily as needed (pain/headache/fever).     pravastatin (PRAVACHOL) 40 MG tablet Take 40 mg by mouth at bedtime.     No current facility-administered medications for this visit.    PHYSICAL EXAMINATION: ECOG PERFORMANCE STATUS: 0 - Asymptomatic  Vitals:   10/02/21 1329  BP: 135/65  Pulse: (!) 51  Resp: 16  Temp: 97.9 F (36.6 C)  SpO2: 98%   Wt Readings from Last 3 Encounters:  10/02/21 157  lb 11.2 oz (71.5 kg)  07/02/21 159 lb 8 oz (72.3 kg)  02/28/21 156 lb 6.4 oz (70.9 kg)     GENERAL:alert, no distress and comfortable SKIN: skin color, texture, turgor are normal, no rashes or significant lesions EYES: normal, Conjunctiva are pink and non-injected, sclera clear  NECK: supple, thyroid normal size, non-tender, without nodularity LYMPH:  no palpable lymphadenopathy in the cervical, axillary  LUNGS: clear to auscultation and percussion with normal breathing effort HEART: regular rate & rhythm and no murmurs and no lower extremity edema ABDOMEN:abdomen soft, non-tender and normal bowel sounds Musculoskeletal:no cyanosis of digits and no clubbing  NEURO: alert & oriented x 3 with fluent speech, no focal motor/sensory deficits  LABORATORY DATA:  I have reviewed the data as listed    Latest Ref Rng & Units 10/02/2021   12:40 PM 06/28/2021    3:05 PM 02/03/2021    4:09 AM  CBC  WBC 4.0 - 10.5 K/uL 4.0  5.0  4.7   Hemoglobin 13.0 - 17.0 g/dL 13.3  14.0  12.3   Hematocrit 39.0 - 52.0 % 39.3  41.3  37.4   Platelets 150 - 400 K/uL 161  168  148         Latest Ref Rng & Units 10/02/2021   12:40 PM 06/28/2021    3:05 PM 02/03/2021    4:09 AM  CMP  Glucose 70 - 99 mg/dL 106  119  84   BUN 8 - 23 mg/dL '14  17  17   '$ Creatinine 0.61 - 1.24 mg/dL 0.91  0.99  0.86   Sodium 135 - 145 mmol/L 140  140  139   Potassium 3.5 - 5.1 mmol/L 4.3  4.0  3.8   Chloride 98 - 111 mmol/L 107  107  106   CO2 22 - 32 mmol/L '29  26  27   '$ Calcium 8.9 - 10.3 mg/dL 8.8  9.2  8.7   Total Protein 6.5 - 8.1 g/dL 6.6  7.1    Total Bilirubin 0.3 - 1.2 mg/dL 0.6  0.7    Alkaline Phos 38 - 126 U/L 75  81    AST 15 - 41 U/L 19  18    ALT 0 - 44 U/L 17  14        RADIOGRAPHIC STUDIES: I have personally reviewed the radiological images as listed and agreed with the findings in the report. No results found.    Orders Placed This Encounter  Procedures   CT CHEST ABDOMEN PELVIS W CONTRAST     Standing Status:   Future    Standing Expiration Date:   10/03/2022  Order Specific Question:   Preferred imaging location?    Answer:   [facility]    Order Specific Question:   Is Oral Contrast requested for this exam?    Answer:   Yes, Per Radiology protocol   All questions were answered. The patient knows to call the clinic with any problems, questions or concerns. No barriers to learning was detected. The total time spent in the appointment was 30 minutes.     Truitt Merle, MD 10/02/2021   I, Wilburn Mylar, am acting as scribe for Truitt Merle, MD.   I have reviewed the above documentation for accuracy and completeness, and I agree with the above.

## 2021-10-19 IMAGING — CT CT ABD-PELV W/ CM
2 of 5 series · 16 of 46 positions shown, 18 images · IV contrast (APPLIED)
Comparison: None.

CLINICAL DATA: Abdominal infection.

EXAM:
CT ABDOMEN AND PELVIS WITH CONTRAST
TECHNIQUE: Multidetector CT imaging of the abdomen and pelvis was performed
using the standard protocol following bolus administration of
intravenous contrast.
CONTRAST:  75mL OMNIPAQUE IOHEXOL 350 MG/ML SOLN

[Series 3: abd/ pelvis 5.0 i30f 2 · axial · 0.79mm/px · z∈[+1016,+1376]mm · 13 of 82 slices shown, 15 images]
[im 5/82  soft-tissue]
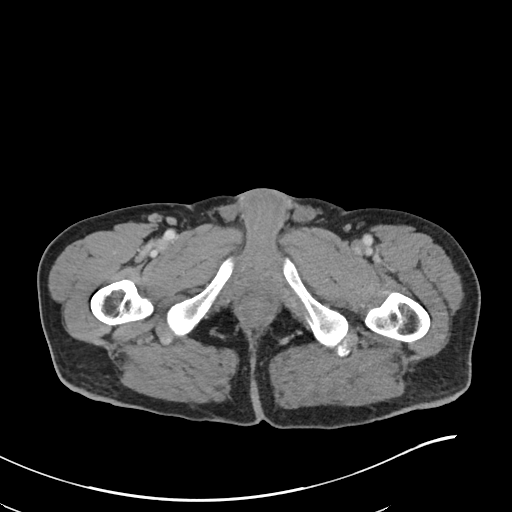
[im 5/82  bone]
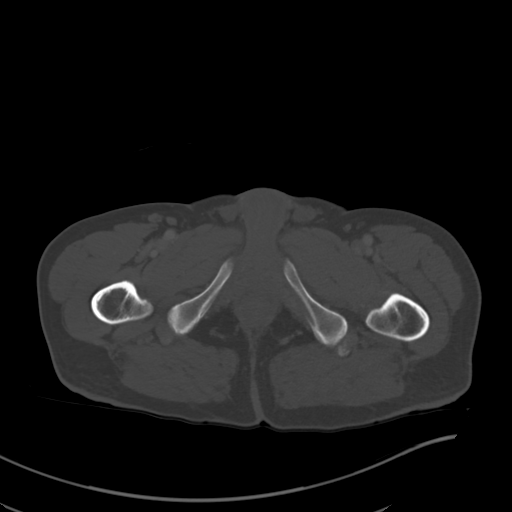
[im 13/82  soft-tissue]
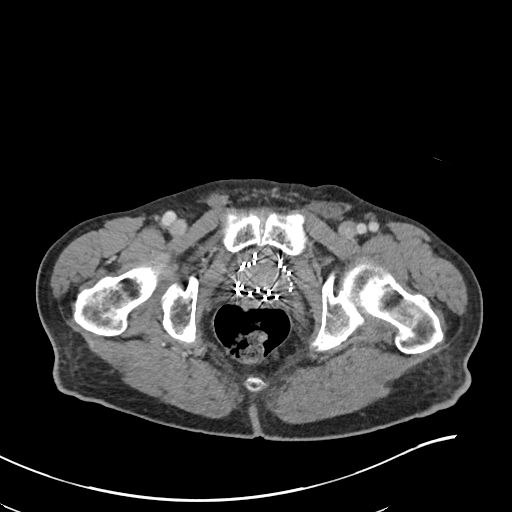
[im 17/82  soft-tissue]
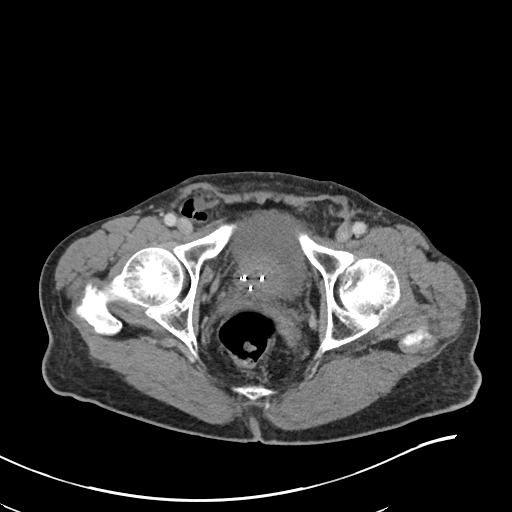
[im 25/82  soft-tissue]
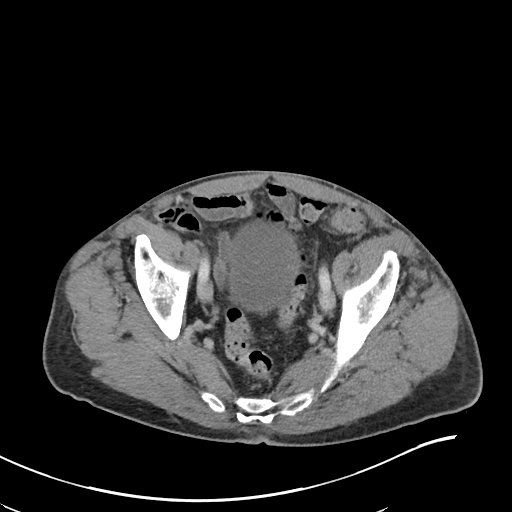
[im 29/82  soft-tissue]
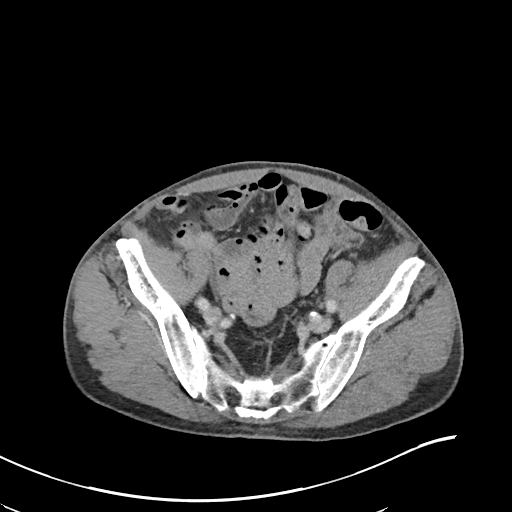
[im 37/82  soft-tissue]
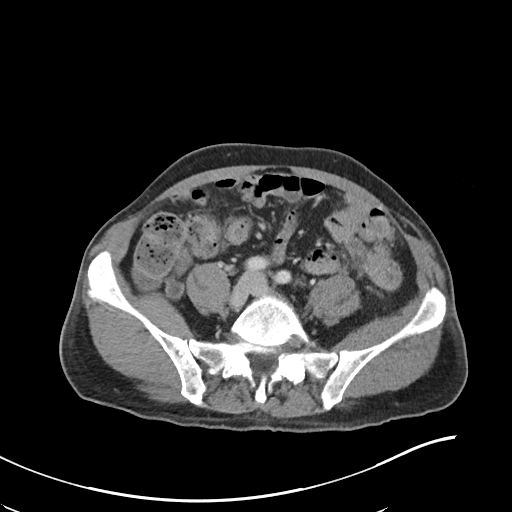
[im 41/82  soft-tissue]
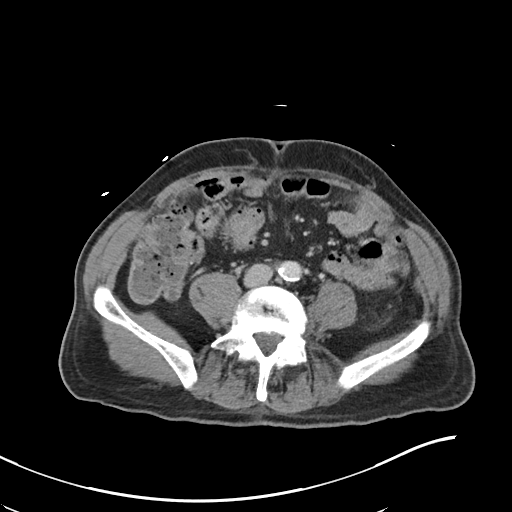
[im 45/82  soft-tissue]
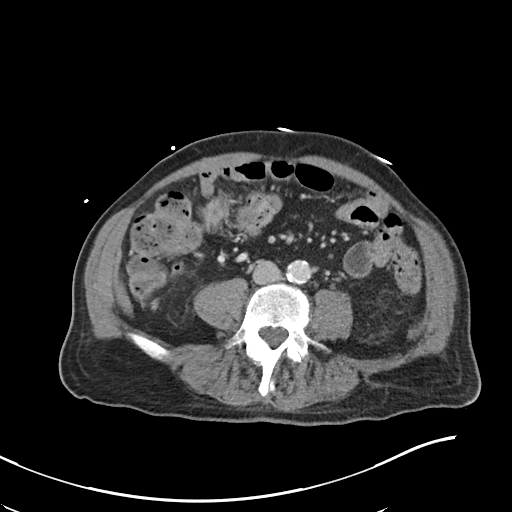
[im 53/82  soft-tissue]
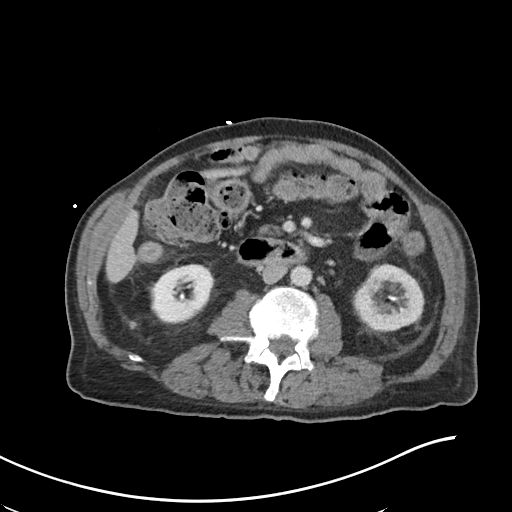
[im 53/82  bone]
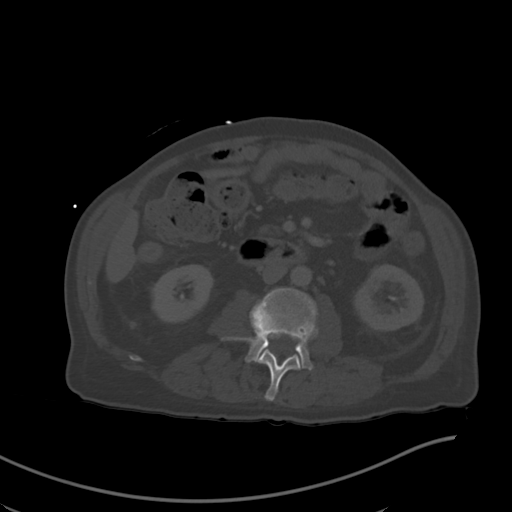
[im 57/82  soft-tissue]
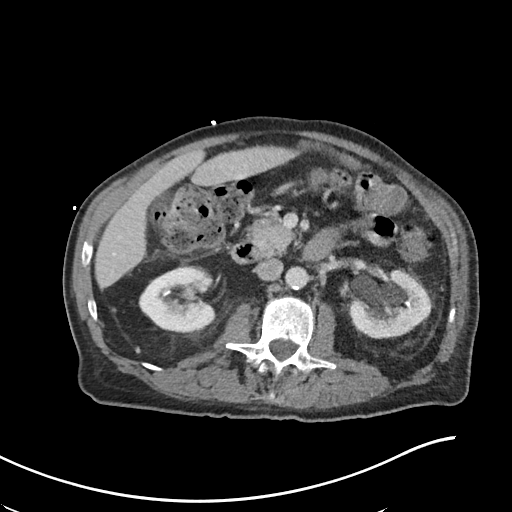
[im 65/82  soft-tissue]
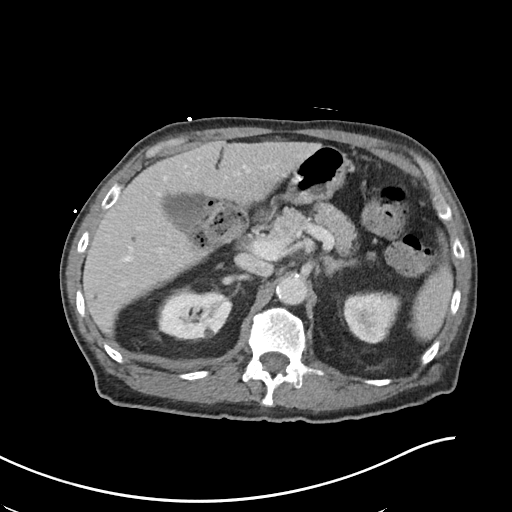
[im 69/82  soft-tissue]
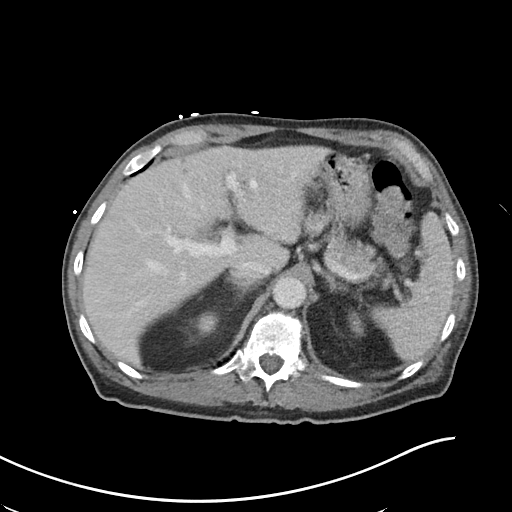
[im 77/82  soft-tissue]
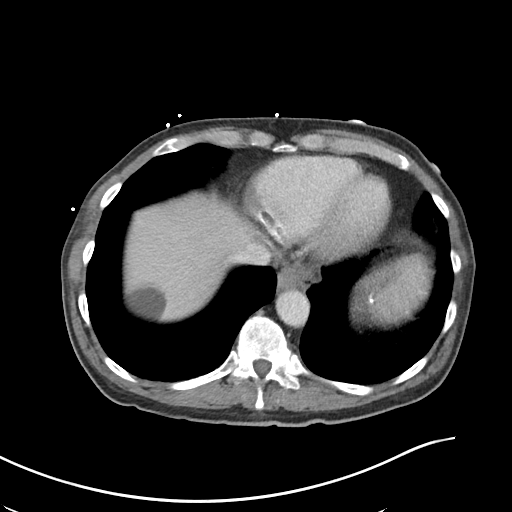

[Series 6: coronal soft tissue · coronal · 0.78mm/px · 3 of 88 slices shown]
[im 30/88  soft-tissue]
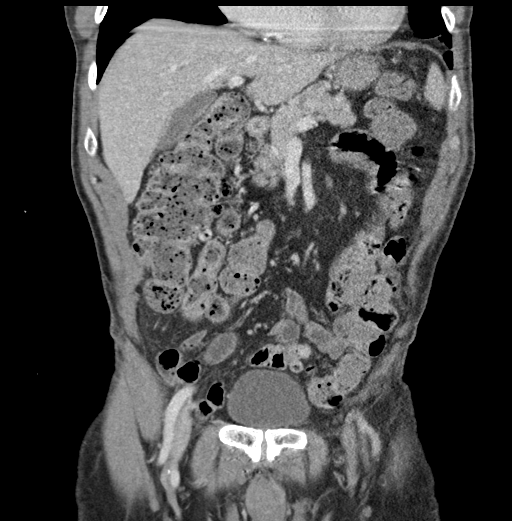
[im 39/88  soft-tissue]
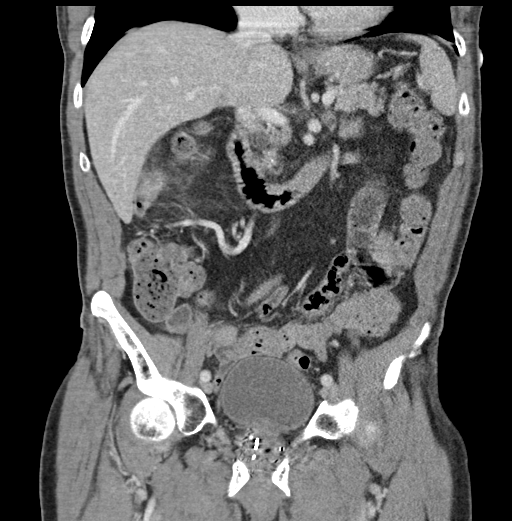
[im 49/88  soft-tissue]
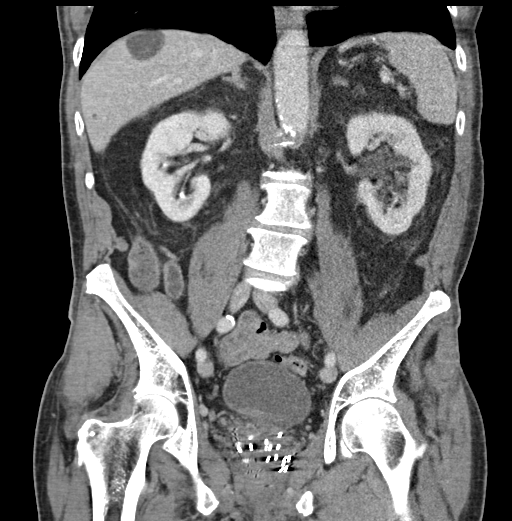

[16 of 46 positions shown; findings below may reference images not displayed]

FINDINGS: Lower chest: Minimal bibasilar dependent atelectasis. The visualized
lung bases are otherwise clear.

No intra-abdominal free air or free fluid.

Hepatobiliary: Several liver cysts measure up to and 4 cm in the
right lobe of the liver. Multiple subcentimeter hypodensities are
too small to characterize. No intrahepatic biliary ductal
dilatation. The gallbladder is unremarkable.

Pancreas: Indeterminate 5 mm low attenuating/cystic lesion with a
small focus of calcification involving the posterior aspect of the
uncinate process of the pancreas ([DATE]) which is not characterized
on this CT but may represent side branch IPMN. This can be better
characterized with MRI on a nonemergent/outpatient basis. No
dilatation of the main pancreatic duct or gland atrophy. No active
inflammatory changes.

Spleen: Small scattered calcified splenic granuloma.

Adrenals/Urinary Tract: The adrenal glands are unremarkable. There
is no hydronephrosis on either side. There is symmetric enhancement
and excretion of contrast by both kidneys. Small bilateral
parapelvic cysts as well as probable small cyst in the upper pole of
the right kidney. The visualized ureters and the urinary bladder
appear unremarkable.

Stomach/Bowel: Small hiatal hernia. There is moderate stool
throughout the colon. There is sigmoid diverticulosis without active
inflammatory changes. The appendix is enlarged and inflamed. The tip
of the appendix measures up to 17 mm in diameter. The appendix is
located posterior to the cecum in the right lower quadrant and
extends superiorly posterior to the ascending colon to the
subhepatic region. No drainable fluid collection/abscess. No
evidence of perforation.

Vascular/Lymphatic: Moderate aortoiliac atherosclerotic disease. The
IVC is unremarkable. No portal venous gas. There is no adenopathy.

Reproductive: The prostate gland is enlarged. Prostate brachytherapy
seeds noted.

Other: Probable prior repair of right inguinal hernia. There is
slight protrusion of a short segment of distal small bowel. No
obstruction.

Musculoskeletal: Osteopenia with degenerative changes of the spine.
No acute osseous pathology.
IMPRESSION: 1. Acute appendicitis. No abscess or perforation.
2. Sigmoid diverticulosis.
3. Indeterminate 5 mm low attenuating/cystic lesion involving the
posterior aspect of the uncinate process of the pancreas. This can
be better characterized with MRI on a nonemergent/outpatient basis.
4. Aortic Atherosclerosis ([id number]).

## 2021-10-24 ENCOUNTER — Telehealth: Payer: Self-pay

## 2021-10-24 NOTE — Telephone Encounter (Signed)
Spoke with pt and spouse via telephone regarding colonoscopy results.  Informed pt that Dr. Carol Ada and Dr. Burr Medico have discussed the results of the pt's recent colonoscopy.  Both providers think it is safe not to repeat a surveillance colonoscopy since his examination last year was normal. Pt and spouse had no further questions or concerns at this time.

## 2021-12-30 ENCOUNTER — Inpatient Hospital Stay: Payer: PPO | Attending: Hematology

## 2021-12-30 DIAGNOSIS — Z8546 Personal history of malignant neoplasm of prostate: Secondary | ICD-10-CM | POA: Insufficient documentation

## 2021-12-30 DIAGNOSIS — Z923 Personal history of irradiation: Secondary | ICD-10-CM | POA: Insufficient documentation

## 2021-12-30 DIAGNOSIS — Z9049 Acquired absence of other specified parts of digestive tract: Secondary | ICD-10-CM | POA: Diagnosis not present

## 2021-12-30 DIAGNOSIS — Z79899 Other long term (current) drug therapy: Secondary | ICD-10-CM | POA: Insufficient documentation

## 2021-12-30 DIAGNOSIS — C181 Malignant neoplasm of appendix: Secondary | ICD-10-CM | POA: Diagnosis present

## 2021-12-30 LAB — CBC WITH DIFFERENTIAL (CANCER CENTER ONLY)
Abs Immature Granulocytes: 0.02 10*3/uL (ref 0.00–0.07)
Basophils Absolute: 0 10*3/uL (ref 0.0–0.1)
Basophils Relative: 1 %
Eosinophils Absolute: 0.2 10*3/uL (ref 0.0–0.5)
Eosinophils Relative: 3 %
HCT: 40.4 % (ref 39.0–52.0)
Hemoglobin: 14.1 g/dL (ref 13.0–17.0)
Immature Granulocytes: 0 %
Lymphocytes Relative: 12 %
Lymphs Abs: 0.6 10*3/uL — ABNORMAL LOW (ref 0.7–4.0)
MCH: 32.9 pg (ref 26.0–34.0)
MCHC: 34.9 g/dL (ref 30.0–36.0)
MCV: 94.4 fL (ref 80.0–100.0)
Monocytes Absolute: 0.4 10*3/uL (ref 0.1–1.0)
Monocytes Relative: 8 %
Neutro Abs: 3.4 10*3/uL (ref 1.7–7.7)
Neutrophils Relative %: 76 %
Platelet Count: 164 10*3/uL (ref 150–400)
RBC: 4.28 MIL/uL (ref 4.22–5.81)
RDW: 12.8 % (ref 11.5–15.5)
WBC Count: 4.6 10*3/uL (ref 4.0–10.5)
nRBC: 0 % (ref 0.0–0.2)

## 2021-12-30 LAB — CMP (CANCER CENTER ONLY)
ALT: 18 U/L (ref 0–44)
AST: 19 U/L (ref 15–41)
Albumin: 3.9 g/dL (ref 3.5–5.0)
Alkaline Phosphatase: 76 U/L (ref 38–126)
Anion gap: 4 — ABNORMAL LOW (ref 5–15)
BUN: 16 mg/dL (ref 8–23)
CO2: 27 mmol/L (ref 22–32)
Calcium: 8.9 mg/dL (ref 8.9–10.3)
Chloride: 108 mmol/L (ref 98–111)
Creatinine: 0.99 mg/dL (ref 0.61–1.24)
GFR, Estimated: >60 mL/min (ref >60)
Glucose, Bld: 121 mg/dL — ABNORMAL HIGH (ref 70–99)
Potassium: 3.9 mmol/L (ref 3.5–5.1)
Sodium: 139 mmol/L (ref 135–145)
Total Bilirubin: 0.7 mg/dL (ref 0.3–1.2)
Total Protein: 6.4 g/dL — ABNORMAL LOW (ref 6.5–8.1)

## 2021-12-30 LAB — CEA (IN HOUSE-CHCC): CEA (CHCC-In House): 3.74 ng/mL (ref 0.00–5.00)

## 2021-12-31 ENCOUNTER — Ambulatory Visit (HOSPITAL_COMMUNITY)
Admission: RE | Admit: 2021-12-31 | Discharge: 2021-12-31 | Disposition: A | Discharge status: Home / Self Care | Payer: PPO | Source: Ambulatory Visit | Attending: Hematology | Admitting: Hematology

## 2021-12-31 DIAGNOSIS — C181 Malignant neoplasm of appendix: Secondary | ICD-10-CM | POA: Diagnosis not present

## 2021-12-31 MED ORDER — IOHEXOL 9 MG/ML PO SOLN
1000.0000 mL | Freq: Once | ORAL | Status: AC
  Administered 2021-12-31: 1000 mL via ORAL

## 2021-12-31 MED ORDER — IOHEXOL 300 MG/ML  SOLN
100.0000 mL | Freq: Once | INTRAMUSCULAR | Status: AC | PRN
  Administered 2021-12-31: 100 mL via INTRAVENOUS

## 2022-01-01 ENCOUNTER — Inpatient Hospital Stay (HOSPITAL_BASED_OUTPATIENT_CLINIC_OR_DEPARTMENT_OTHER): Payer: PPO | Admitting: Hematology

## 2022-01-01 VITALS — BP 150/75 | HR 59 | Temp 98.2°F | Resp 18 | Ht 68.5 in | Wt 160.3 lb

## 2022-01-01 DIAGNOSIS — C61 Malignant neoplasm of prostate: Secondary | ICD-10-CM

## 2022-01-01 DIAGNOSIS — C181 Malignant neoplasm of appendix: Secondary | ICD-10-CM | POA: Diagnosis not present

## 2022-01-01 NOTE — Progress Notes (Signed)
Chad Ford   [phone]) [phone] [phone]) [phone]   Clinic Follow up Note   Patient Care Team: Amie Critchley as PCP - General (Family Medicine) Janne Napoleon, PA-C (Inactive) (Dermatology)  Date of Service:  01/01/2022  CHIEF COMPLAINT: f/u of appendiceal cancer    CURRENT THERAPY:  Surveillance   ASSESSMENT:  Chad Ford is a 81 y.o. 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 Abner Greenspan and Dr. Tammi Klippel -PSA f/u with Dr. Abner Greenspan, low per pt.  Primary appendiceal adenocarcinoma (HCC) -pT4N0M0, stage II   -diagnosed in 08/2020 --s/p right hemicolectomy on 01/31/21 with Dr. Dema Severin, pathology from colon and all 26 lymph nodes was negative. -he is on cancer surveillance. -restaging CT AP 06/28/21 showed NED.    PLAN: - Discuss Scan- Negative. -lab reviewed. He is clinically doing well, no concerns.  -f/u and CT Scan in 6 mths  SUMMARY OF ONCOLOGIC HISTORY: Oncology History  Malignant neoplasm of prostate ([place])  07/22/2019 Cancer Staging   Staging form: Prostate, AJCC 8th Edition - Clinical stage from 07/22/2019: Stage IIC (cT1c, cN0, cM0, PSA: 11.5, Grade Group: 3) - Signed by Freeman Caldron, PA-C on 08/26/2019   08/26/2019 Initial Diagnosis   Malignant neoplasm of prostate [facility])   Primary appendiceal adenocarcinoma ([place])  09/03/2020 Imaging   EXAM: CT ABDOMEN AND PELVIS WITH CONTRAST  IMPRESSION: 1. Acute appendicitis. No abscess or perforation. 2. Sigmoid diverticulosis. 3. Indeterminate 5 mm low attenuating/cystic lesion involving the posterior aspect of the uncinate process of the pancreas. This can be better characterized with MRI on a nonemergent/outpatient basis. 4. Aortic Atherosclerosis (ICD10-I70.0).   09/04/2020 Pathology Results   FINAL MICROSCOPIC DIAGNOSIS:   A. APPENDIX, APPENDECTOMY:  -  Adenocarcinoma with neuroendocrine features, moderately   differentiated  -  Proximal margin uninvolved by adenocarcinoma  -  See oncology table below    10/02/2020 Imaging   EXAM: CT CHEST WITH CONTRAST  IMPRESSION: Small sliding-type hiatal hernia.   No other significant abnormality seen in the chest.   12/21/2020 Imaging   EXAM: MRI ABDOMEN WITHOUT AND WITH CONTRAST  IMPRESSION: 1. 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 The largest of these measures up to 1.3 x 0.8 cm in the posterior aspect of the pancreatic head. Repeat abdominal MRI with and without IV gadolinium with MRCP is recommended in 2 years to ensure stability of these findings. This recommendation follows ACR consensus guidelines: Management of Incidental Pancreatic Cysts: A White Paper of the ACR Incidental Findings Committee. J Am Coll Radiol 4696;29:528-413. 2. Mild left-sided hydronephrosis, suggestive of potential left UPJ obstruction. Further clinical evaluation is recommended. 3. Aortic atherosclerosis. 4. Additional incidental findings, as above.   01/31/2021 Pathology Results   FINAL MICROSCOPIC DIAGNOSIS:   A. COLON, RIGHT, HEMI-COLECTOMY:  -  Benign colon  -  No carcinoma identified in twenty-six lymph nodes(0/26)    02/27/2021 Initial Diagnosis   Primary appendiceal adenocarcinoma ([place])   12/31/2021 Imaging    IMPRESSION: 1. No evidence of local recurrence or metastatic disease within the chest, abdomen, or pelvis. 2. Large volume of formed stool in the colon suggestive of constipation. 3. 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 4. Mild symmetric esophageal wall thickening, correlate for symptoms of esophagitis. 5.  Nonobstructive 6 mm left lower pole renal stone. 6.  Aortic Atherosclerosis  (ICD10-I70      INTERVAL HISTORY:  Chad Ford is here for a follow up of appendiceal cancer   He was last seen by me on 10/02/21  He presents to the clinic accompanied by wife. Pt denies stomach issues. BM is good, appetite is good.    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   Right [place] (curet and 5FU)   Nodular basal cell carcinoma ([facility]CC) 02/06/2017   Left Upper Forehead (excision)   Prostate cancer ([facility]    Psoriasis    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   Right Forearm (Keratoacanthoma) (curet and 5FU)   Superficial basal cell carcinoma (BCC) 06/16/2019   Left Upper Arm - Anterior   Vertigo    treats with specific exercises    SURGICAL HISTORY: Past Surgical History:  Procedure Laterality Date   APPENDECTOMY  09/04/2020   CIRCUMCISION     as a child   COLON SURGERY  01/31/2021   COLONOSCOPY     COLONOSCOPY W/ POLYPECTOMY     HEMORRHOID SURGERY  2000   HERNIA REPAIR Right 2002   Inguinal   INGUINAL HERNIA REPAIR Left 12/31/2015   Procedure: HERNIA REPAIR INGUINAL ADULT;  Surgeon: BGeorganna Skeans MD;  [facility]  Service: General;  Laterality: Left;   INSERTION OF MESH Left 12/31/2015   Procedure: INSERTION OF MESH;  Surgeon: BGeorganna Skeans MD;  [place] [facility]  Service: General;  Laterality: Left;   LAPAROSCOPIC APPENDECTOMY N/A 09/04/2020   Procedure: APPENDECTOMY LAPAROSCOPIC;  Surgeon: CErroll Luna MD;  [place] [facility]  Service: General;   Laterality: N/A;   LAPAROSCOPIC RIGHT HEMI COLECTOMY Right 01/31/2021   Procedure: LAPAROSCOPIC RIGHT HEMI COLECTOMY WITH TAPPS BLOCK;  Surgeon: WIleana Roup MD;  Location[facility];  Service: General;  Laterality: Right;   RADIOACTIVE SEED IMPLANT N/A 10/28/2019   Procedure: RADIOACTIVE SEED IMPLANT/BRACHYTHERAPY IMPLANT;  Surgeon: GJanith Lima MD;  [facility]  Service: Urology;  Laterality: N/A;   SPACE OAR INSTILLATION N/A 10/28/2019   Procedure: SPACE OAR INSTILLATION;  Surgeon: GJanith Lima MD;  [facility]  Service: Urology;  Laterality: N/A;   TONSILLECTOMY     as a child    I have reviewed the social history and family history with the patient and they are unchanged from previous note.  ALLERGIES:  is allergic to penicillins.  MEDICATIONS:  Current Outpatient Medications  Medication Sig Dispense Refill   amLODipine (NORVASC) 5 MG tablet Take 5 mg by mouth daily.     benazepril (LOTENSIN) 40 MG tablet Take 40 mg by mouth at bedtime.     ciclopirox (LOPROX) 0.77 % cream Apply 1 application topically See admin instructions. Apply to feet every other nite 90 g 2   doxazosin (CARDURA) 8 MG tablet  Take 8 mg by mouth at bedtime.     naproxen sodium (ALEVE) 220 MG tablet Take 220 mg by mouth 2 (two) times daily as needed (pain/headache/fever).     pravastatin (PRAVACHOL) 40 MG tablet Take 40 mg by mouth at bedtime.     No current facility-administered medications for this visit.    PHYSICAL EXAMINATION: ECOG PERFORMANCE STATUS: 0 - Asymptomatic  Vitals:   01/01/22 1457  BP: (!) 150/75  Pulse: (!) 59  Resp: 18  Temp: 98.2 F (36.8 C)  SpO2: 98%   Wt Readings from Last 3 Encounters:  01/01/22 160 lb 4.8 oz (72.7 kg)  10/02/21 157 lb 11.2 oz (71.5 kg)  07/02/21 159 lb 8 oz (72.3 kg)     GENERAL:alert, no distress and comfortable SKIN: skin color normal, no rashes or significant lesions EYES: normal,  Conjunctiva are pink and non-injected, sclera clear  NEURO: alert & oriented x 3 with fluent speech LABORATORY DATA:  I have reviewed the data as listed    Latest Ref Rng & Units 12/30/2021   10:47 AM 10/02/2021   12:40 PM 06/28/2021    3:05 PM  CBC  WBC 4.0 - 10.5 K/uL 4.6  4.0  5.0   Hemoglobin 13.0 - 17.0 g/dL 14.1  13.3  14.0   Hematocrit 39.0 - 52.0 % 40.4  39.3  41.3   Platelets 150 - 400 K/uL 164  161  168         Latest Ref Rng & Units 12/30/2021   10:47 AM 10/02/2021   12:40 PM 06/28/2021    3:05 PM  CMP  Glucose 70 - 99 mg/dL 121  106  119   BUN 8 - 23 mg/dL '16  14  17   '$ Creatinine 0.61 - 1.24 mg/dL 0.99  0.91  0.99   Sodium 135 - 145 mmol/L 139  140  140   Potassium 3.5 - 5.1 mmol/L 3.9  4.3  4.0   Chloride 98 - 111 mmol/L 108  107  107   CO2 22 - 32 mmol/L '27  29  26   '$ Calcium 8.9 - 10.3 mg/dL 8.9  8.8  9.2   Total Protein 6.5 - 8.1 g/dL 6.4  6.6  7.1   Total Bilirubin 0.3 - 1.2 mg/dL 0.7  0.6  0.7   Alkaline Phos 38 - 126 U/L 76  75  81   AST 15 - 41 U/L '19  19  18   '$ ALT 0 - 44 U/L '18  17  14       '$ RADIOGRAPHIC STUDIES: I have personally reviewed the radiological images as listed and agreed with the findings in the report. CT CHEST ABDOMEN PELVIS W CONTRAST  Result Date: 01/01/2022 CLINICAL DATA:  History of primary appendiceal adenocarcinoma. Follow-up/surveillance. * Tracking Code: BO * EXAM: CT CHEST, ABDOMEN, AND PELVIS WITH CONTRAST TECHNIQUE: Multidetector CT imaging of the chest, abdomen and pelvis was performed following the standard protocol during bolus administration of intravenous contrast. RADIATION DOSE REDUCTION: This exam was performed according to the departmental dose-optimization program which includes automated exposure control, adjustment of the mA and/or kV according to patient size and/or use of iterative reconstruction technique. CONTRAST:  148m OMNIPAQUE IOHEXOL 300 MG/ML  SOLN COMPARISON:  Multiple priors including CT June 28, 2021, MRI  December 21, 2020 and CT October 02, 2020. FINDINGS: CT CHEST FINDINGS Cardiovascular: Aortic atherosclerosis. No central pulmonary embolus on this nondedicated study. Normal size heart. Coronary artery calcifications. No significant pericardial  effusion/thickening. Mediastinum/Nodes: No supraclavicular adenopathy. No suspicious thyroid nodule. No pathologically enlarged mediastinal, hilar or axillary lymph nodes. Mild symmetric esophageal wall thickening. Lungs/Pleura: Benign calcified granuloma in the right lower lobe. No suspicious pulmonary nodules or masses. Mild biapical pleuroparenchymal scarring. No focal airspace consolidation. No pleural effusion. No pneumothorax. Musculoskeletal: No aggressive lytic or blastic lesion of bone. Multilevel degenerative changes spine. Degenerative changes bilateral glenohumeral joints. CT ABDOMEN PELVIS FINDINGS Hepatobiliary: Bilobar hypodense hepatic lesions are stable from prior and previously characterized as benign cysts on MRI December 21, 2020. No new suspicious hepatic lesion. Stable segment IVb calcified granuloma. Gallbladder is unremarkable. No biliary ductal dilation. 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 No pancreatic ductal dilation or evidence of acute inflammation. Spleen: No splenomegaly or focal splenic lesion. Calcified splenic granulomata. Adrenals/Urinary Tract: Bilateral adrenal glands appear normal. No hydronephrosis. Bilateral renal sinus cysts are considered benign and requiring no independent imaging follow-up. Nonobstructive 6 mm left lower pole renal stone. Kidneys demonstrate symmetric enhancement and excretion of contrast material. Stomach/Bowel: Radiopaque enteric contrast material traverses mid transverse colon. Stomach is distended with ingested material without focal wall  thickening. No pathologic dilation of small or large bowel. Prior partial colectomy/appendectomy with anastomotic sutures in the right upper quadrant. No new suspicious enhancing nodularity along the suture line. Large volume of formed stool in the colon suggestive of constipation. Vascular/Lymphatic: Aortic atherosclerosis. No pathologically enlarged abdominal or pelvic lymph nodes. 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 Other: No significant abdominopelvic free fluid. No discrete peritoneal or omental nodularity. Postsurgical change in the abdominal wall. Musculoskeletal: No aggressive lytic or blastic lesion of bone. Multilevel degenerative change of the spine. IMPRESSION: 1. No evidence of local recurrence or metastatic disease within the chest, abdomen, or pelvis. 2. Large volume of formed stool in the colon suggestive of constipation. 3. 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 4. Mild symmetric esophageal wall thickening, correlate for symptoms of esophagitis. 5. Nonobstructive 6 mm left lower pole renal stone. 6.  Aortic Atherosclerosis (ICD10-I70.0). Electronically Signed   By: Dahlia Bailiff M.D.   On: 01/01/2022 11:32      No orders of the defined types were placed in this encounter.  All questions were answered. The patient knows to call the clinic with any problems, questions or concerns. No barriers to learning was detected. The total time spent in the appointment was 30 minutes.     Truitt Merle, MD 01/01/2022   Felicity Coyer, CMA, am acting as scribe for Truitt Merle, MD.   I have reviewed the above documentation for accuracy and completeness, and I agree with the above.

## 2022-01-01 NOTE — Assessment & Plan Note (Signed)
-  pT4N0M0, stage II   -diagnosed in 08/2020 --s/p right hemicolectomy on 01/31/21 with Dr. Dema Severin, pathology from colon and all 26 lymph nodes was negative. -he is on cancer surveillance. -restaging CT AP 06/28/21 showed NED.

## 2022-01-01 NOTE — Assessment & Plan Note (Signed)
-  diagnosed 08/2019, s/p brachytherapy on 10/28/19 under Dr. Abner Greenspan and Dr. Tammi Klippel -PSA f/u with Dr. Abner Greenspan, low per pt.

## 2022-01-05 ENCOUNTER — Encounter: Payer: Self-pay | Admitting: Hematology

## 2022-02-05 IMAGING — MR MR ABDOMEN WO/W CM
18 of 20 series · 45 of 48 positions shown · IV contrast (gadavist)
Comparison: CT the chest, CT the abdomen and pelvis 09/03/2020.

CLINICAL DATA: 80-year-old male with history of indeterminate
lesion in the uncinate process of the pancreas on recent CT
examination. Follow-up study.

EXAM:
MRI ABDOMEN WITHOUT AND WITH CONTRAST
TECHNIQUE: Multiplanar multisequence MR imaging of the abdomen was performed
both before and after the administration of intravenous contrast.
CONTRAST:  7mL GADAVIST GADOBUTROL 1 MMOL/ML IV SOLN

[Series 3: T2 · coronal · 7.0mm · 1.56mm/px · 2 of 28 slices shown (1 of 2)]
[im 1/28]
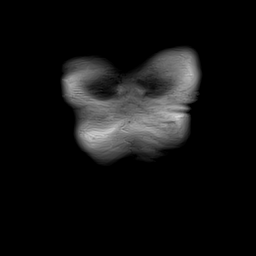
[im 28/28]
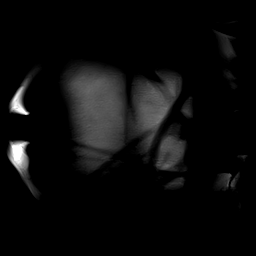

[Series 4: T2 fat-sat · axial · 6.0mm · 1.12mm/px · z∈[-154,+127]mm · 2 of 40 slices shown]
[im 1/40]
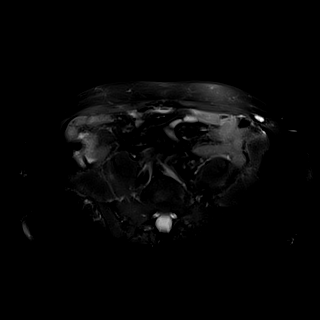
[im 40/40]
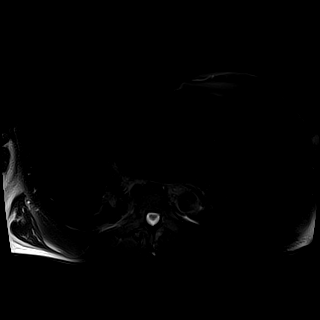

[Series 6: DWI · axial · 6.0mm · 1.49mm/px · z∈[-175,+113]mm · 3 of 82 slices shown (1 of 2)]
[im 1/82]
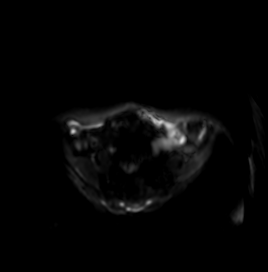
[im 41/82]
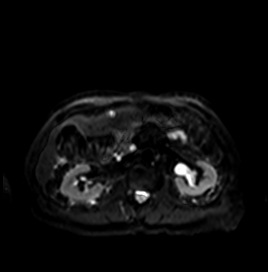
[im 82/82]
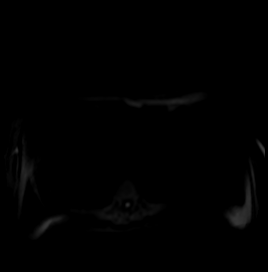

[Series 7: DWI · axial · 6.0mm · 1.49mm/px · 1 of 41 slices shown (2 of 2)]
[im 1/41]
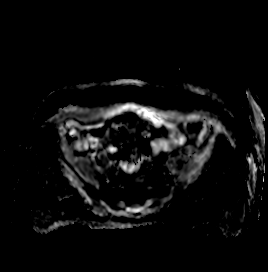

[Series 8: T1 · axial · 3.1mm · 1.25mm/px · z∈[-138,+131]mm · 3 of 88 slices shown (1 of 2)]
[im 1/88]
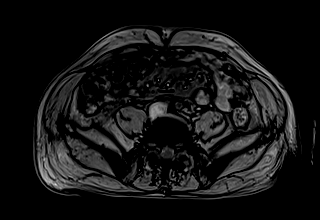
[im 44/88]
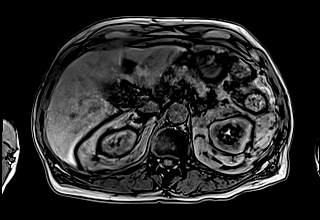
[im 88/88]
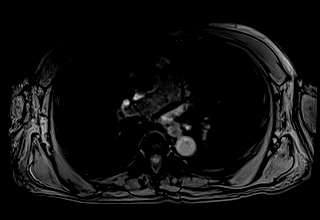

[Series 9: T1 · axial · 3.1mm · 1.25mm/px · z∈[-138,+131]mm · 3 of 88 slices shown (2 of 2)]
[im 1/88]
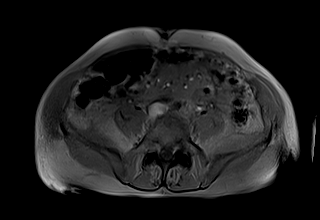
[im 44/88]
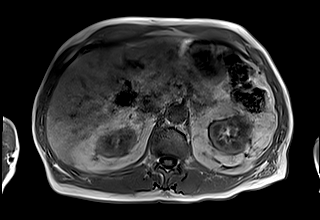
[im 88/88]
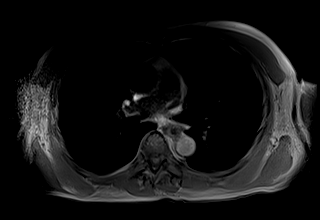

[Series 10: bSSFP · axial · 7.0mm · 1.25mm/px · 1 of 37 slices shown]
[im 1/37]
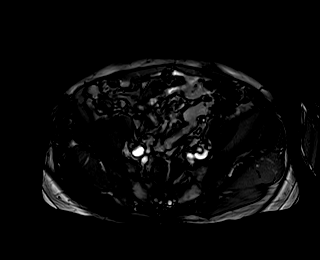

[Series 13: T1 dynamic · axial · 3.0mm · 1.25mm/px · z∈[-156,+129]mm · 3 of 96 slices shown (1 of 10)]
[im 1/96]
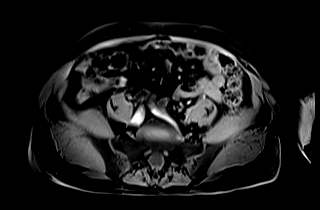
[im 48/96]
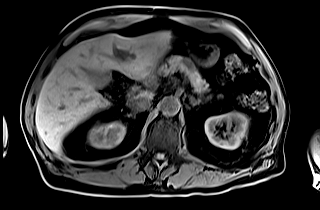
[im 96/96]
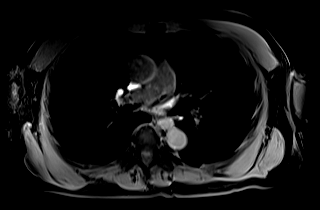

[Series 17: T1 dynamic · axial · 3.0mm · 1.25mm/px · z∈[-156,+129]mm · 3 of 96 slices shown (2 of 10)]
[im 1/96]
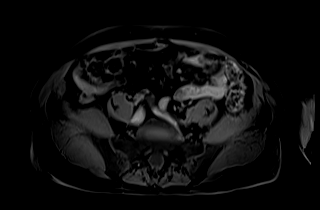
[im 48/96]
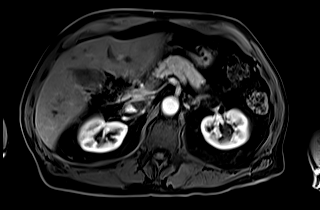
[im 96/96]
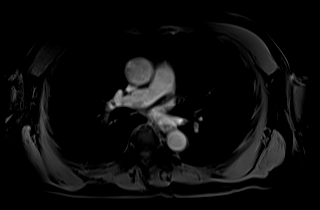

[Series 18: T1 dynamic · axial · 3.0mm · 1.25mm/px · z∈[-156,+129]mm · 3 of 96 slices shown (3 of 10)]
[im 1/96]
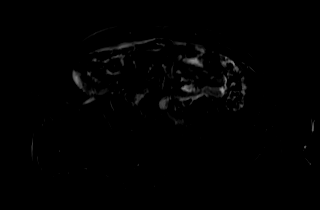
[im 48/96]
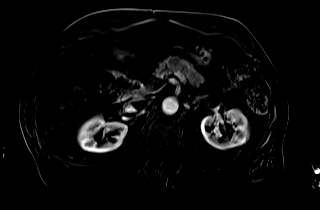
[im 96/96]
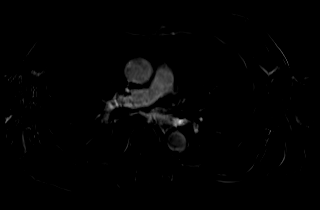

[Series 21: T1 dynamic · axial · 3.0mm · 1.25mm/px · z∈[-156,+129]mm · 3 of 96 slices shown (4 of 10)]
[im 1/96]
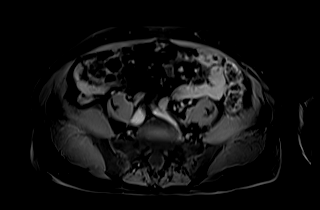
[im 48/96]
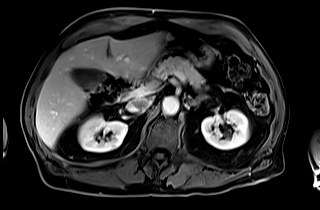
[im 96/96]
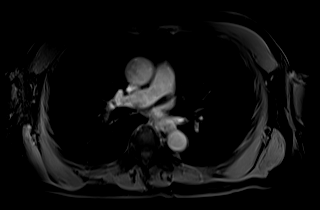

[Series 22: T1 dynamic · axial · 3.0mm · 1.25mm/px · z∈[-156,+129]mm · 3 of 96 slices shown (5 of 10)]
[im 1/96]
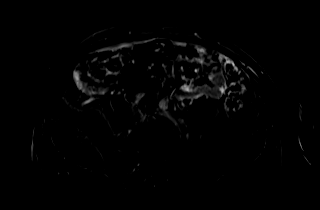
[im 48/96]
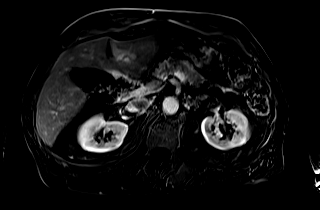
[im 96/96]
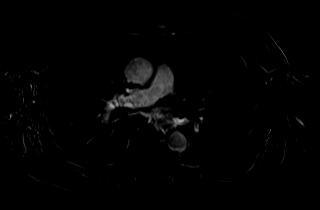

[Series 25: T1 dynamic · axial · 3.0mm · 1.25mm/px · z∈[-156,+129]mm · 3 of 96 slices shown (6 of 10)]
[im 1/96]
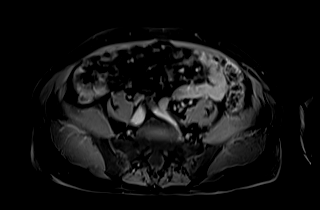
[im 48/96]
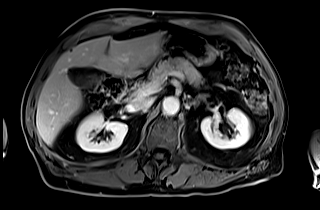
[im 96/96]
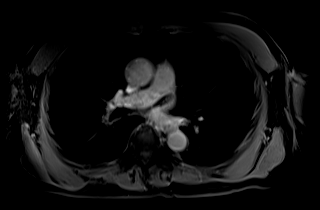

[Series 26: T1 dynamic · axial · 3.0mm · 1.25mm/px · z∈[-156,+129]mm · 3 of 96 slices shown (7 of 10)]
[im 1/96]
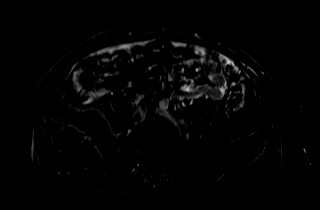
[im 48/96]
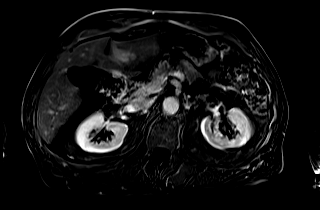
[im 96/96]
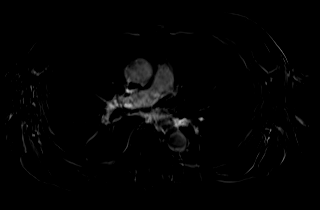

[Series 28: T1 dynamic · coronal · 4.0mm · 1.41mm/px · 2 of 64 slices shown (8 of 10)]
[im 1/64]
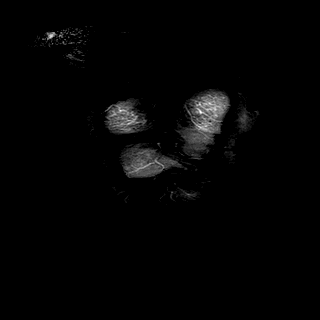
[im 64/64]
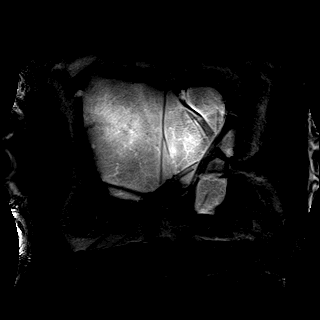

[Series 29: T2 · axial · 6.0mm · 1.56mm/px · 1 of 40 slices shown (2 of 2)]
[im 1/40]
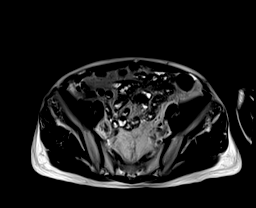

[Series 32: T1 dynamic · axial · 3.0mm · 1.25mm/px · z∈[-156,+129]mm · 3 of 96 slices shown (9 of 10)]
[im 1/96]
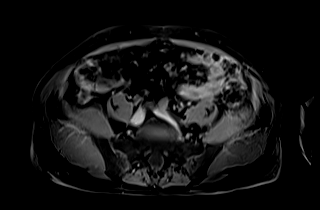
[im 48/96]
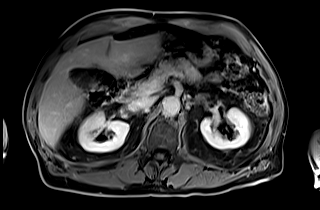
[im 96/96]
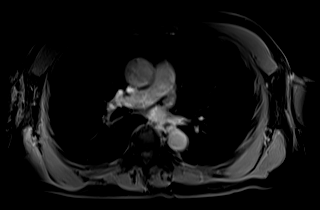

[Series 33: T1 dynamic · axial · 3.0mm · 1.25mm/px · z∈[-156,+129]mm · 3 of 96 slices shown (10 of 10)]
[im 1/96]
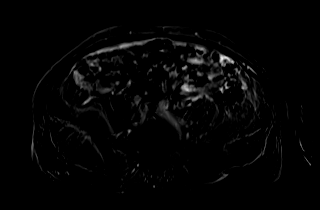
[im 48/96]
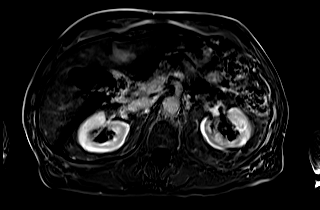
[im 96/96]
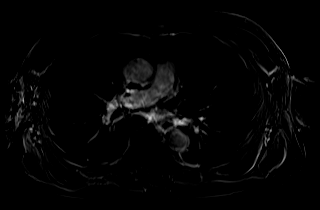

[45 of 48 positions shown; findings below may reference images not displayed]

FINDINGS: Lower chest: Unremarkable.

Hepatobiliary: Scattered throughout the liver there are multiple T1
hypointense, T2 hyperintense, nonenhancing lesions, compatible with
biliary cysts and/or biliary hamartomas. The largest cyst is in
segment 7 of the liver measuring 3.9 cm in diameter. No other
suspicious hepatic lesions are noted. No intra or extrahepatic
biliary ductal dilatation noted on MRCP images. Common bile duct
measures only 3 mm in the porta hepatis.

Pancreas: There are multiple T1 hypointense, T2 hyperintense,
nonenhancing lesions noted in the pancreas, largest of which are
located in the head and uncinate process of the pancreas, both
imaged on axial image 19 of series 29, measuring 1.3 x 0.8 cm in the
posterior aspect of the pancreatic head, and 0.9 x 0.8 cm in the
uncinate process. These do not appear to communicate with the main
pancreatic duct. Main pancreatic duct is not dilated on MRCP images.
No other suspicious appearing pancreatic lesions are identified.
Specifically, no definite solid enhancing pancreatic mass. No
peripancreatic fluid collections or inflammatory changes.

Spleen:  Unremarkable.

Adrenals/Urinary Tract: Small T1 hypointense, T2 hyperintense,
nonenhancing lesions are noted in both kidneys, compatible with tiny
subcentimeter cysts. No aggressive appearing renal lesions. Mild
left-sided hydronephrosis which terminates at the left ureteropelvic
junction. No right hydroureteronephrosis noted in the visualized
portions of the abdomen. Bilateral adrenal glands are normal in
appearance.

Stomach/Bowel: Visualized portions are unremarkable.

Vascular/Lymphatic: Aortic atherosclerosis, without evidence of
aneurysm in the visualized abdominal vasculature. No lymphadenopathy
noted in the abdomen.

Other: No significant volume of ascites noted in the visualized
portions of the peritoneal cavity.

Musculoskeletal: No aggressive appearing osseous lesions are noted
in the visualized portions of the skeleton.
IMPRESSION: 1. Multiple small benign appearing cystic lesions in the pancreas,
favored to represent pancreatic pseudocysts and/or areas of side
branch ectasia. The largest of these measures up to 1.3 x 0.8 cm in
the posterior aspect of the pancreatic head. Repeat abdominal MRI
with and without IV gadolinium with MRCP is recommended in 2 years
to ensure stability of these findings. This recommendation follows
ACR consensus guidelines: Management of Incidental Pancreatic Cysts:
A White Paper of the ACR Incidental Findings Committee. [HOSPITAL] 4075;[DATE].
2. Mild left-sided hydronephrosis, suggestive of potential left UPJ
obstruction. Further clinical evaluation is recommended.
3. Aortic atherosclerosis.
4. Additional incidental findings, as above.

## 2022-05-01 NOTE — Progress Notes (Signed)
[facility] OFFICE PROGRESS NOTE  Richmond Campbell., PA-C [address] [place] [address]  DIAGNOSIS: f/u of appendiceal cancer   Oncology History  Malignant neoplasm of prostate  07/22/2019 Cancer Staging   Staging form: Prostate, AJCC 8th Edition - Clinical stage from 07/22/2019: Stage IIC (cT1c, cN0, cM0, PSA: 11.5, Grade Group: 3) - Signed by Marcello Fennel, PA-C on 08/26/2019   08/26/2019 Initial Diagnosis   Malignant neoplasm of prostate (HCC)   Primary appendiceal adenocarcinoma  09/03/2020 Imaging   EXAM: CT ABDOMEN AND PELVIS WITH CONTRAST  IMPRESSION: 1. Acute appendicitis. No abscess or perforation. 2. Sigmoid diverticulosis. 3. Indeterminate 5 mm low attenuating/cystic lesion involving the posterior aspect of the uncinate process of the pancreas. This can be better characterized with MRI on a nonemergent/outpatient basis. 4. Aortic Atherosclerosis (ICD10-I70.0).   09/04/2020 Pathology Results   FINAL MICROSCOPIC DIAGNOSIS:   A. APPENDIX, APPENDECTOMY:  -  Adenocarcinoma with neuroendocrine features, moderately  differentiated  -  Proximal margin uninvolved by adenocarcinoma  -  See oncology table below    10/02/2020 Imaging   EXAM: CT CHEST WITH CONTRAST  IMPRESSION: Small sliding-type hiatal hernia.   No other significant abnormality seen in the chest.   12/21/2020 Imaging   EXAM: MRI ABDOMEN WITHOUT AND WITH CONTRAST  IMPRESSION: 1. 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 The largest of these measures up to 1.3 x 0.8 cm in the posterior aspect of the pancreatic head. Repeat abdominal MRI with and without IV gadolinium with MRCP is recommended in 2 years to ensure stability of these findings. This recommendation follows ACR consensus guidelines: Management of Incidental Pancreatic Cysts: A White Paper of the ACR Incidental  Findings Committee. J Am Coll Radiol 2017;14:911-923. 2. Mild left-sided hydronephrosis, suggestive of potential left UPJ obstruction. Further clinical evaluation is recommended. 3. Aortic atherosclerosis. 4. Additional incidental findings, as above.   01/31/2021 Pathology Results   FINAL MICROSCOPIC DIAGNOSIS:   A. COLON, RIGHT, HEMI-COLECTOMY:  -  Benign colon  -  No carcinoma identified in twenty-six lymph nodes(0/26)    02/27/2021 Initial Diagnosis   Primary appendiceal adenocarcinoma (HCC)   12/31/2021 Imaging    IMPRESSION: 1. No evidence of local recurrence or metastatic disease within the chest, abdomen, or pelvis. 2. Large volume of formed stool in the colon suggestive of constipation. 3. 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 4. Mild symmetric esophageal wall thickening, correlate for symptoms of esophagitis. 5. Nonobstructive 6 mm left lower pole renal stone. 6.  Aortic Atherosclerosis (ICD10-I70     CURRENT THERAPY: Surveillance     INTERVAL HISTORY: Chad Ford 82 y.o. male returns to the clinic today for a follow-up visit.  The patient is feeling well today without any concerning complaints.  He was last seen by Dr. Mosetta Putt in December 2023.  He is status post hemicolectomy by Dr. Cliffton Asters in January 2023 for his history of appendiceal cancer.  He is currently on observation and feeling well.  He denies any fever, chills, night sweats, unexplained weight loss, or appetite change.  Denies any chest pain, shortness of breath, cough, or hemoptysis.  Denies any unusual pain.  Denies any nausea, vomiting, diarrhea, constipation, blood in the stool, jaundice, itching, or unusual abdominal pain. He had a follow up colonoscopy  on 03/27/22 which was reportedly normal. I do not have the result. He is here today for evaluation repeat  blood work.    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  MEDICATIONS:  Current Outpatient Medications  Medication Sig Dispense Refill   amLODipine (NORVASC) 5 MG tablet Take 5 mg by mouth daily.     benazepril (LOTENSIN) 40 MG tablet Take 40 mg by mouth at bedtime.     ciclopirox (LOPROX) 0.77 % cream Apply 1 application topically See admin instructions. Apply to feet every other nite 90 g 2   doxazosin (CARDURA) 8 MG tablet Take 8 mg by mouth at bedtime.     naproxen sodium (ALEVE) 220 MG tablet Take 220 mg by mouth 2 (two) times daily as needed (pain/headache/fever).     pravastatin (PRAVACHOL) 40 MG tablet Take 40 mg by mouth at bedtime.     No current facility-administered medications for this visit.    SURGICAL HISTORY:  Past Surgical History:  Procedure Laterality Date   APPENDECTOMY  09/04/2020   CIRCUMCISION     as a child   COLON SURGERY  01/31/2021   COLONOSCOPY     COLONOSCOPY W/ POLYPECTOMY     HEMORRHOID SURGERY  2000   HERNIA REPAIR Right 2002   Inguinal   INGUINAL HERNIA REPAIR Left 12/31/2015   Procedure: HERNIA REPAIR INGUINAL  ADULT;  Surgeon: Violeta Gelinas, MD;  Location: [facility] OR;  Service: General;  Laterality: Left;   INSERTION OF MESH Left 12/31/2015   Procedure: INSERTION OF MESH;  Surgeon: Violeta Gelinas, MD;  Location: [facility] OR;  Service: General;  Laterality: Left;   LAPAROSCOPIC APPENDECTOMY N/A 09/04/2020   Procedure: APPENDECTOMY LAPAROSCOPIC;  Surgeon: Harriette Bouillon, MD;  Location: [facility] OR;  Service: General;  Laterality: N/A;   LAPAROSCOPIC RIGHT HEMI COLECTOMY Right 01/31/2021   Procedure: LAPAROSCOPIC RIGHT HEMI COLECTOMY WITH TAPPS BLOCK;  Surgeon: Andria Meuse, MD;  Location: [facility];  Service: General;  Laterality: Right;   RADIOACTIVE SEED IMPLANT N/A 10/28/2019   Procedure: RADIOACTIVE SEED IMPLANT/BRACHYTHERAPY IMPLANT;  Surgeon: Jannifer Hick, MD;  Location: [facility];  Service: Urology;  Laterality: N/A;   SPACE OAR INSTILLATION N/A 10/28/2019   Procedure: SPACE OAR INSTILLATION;  Surgeon: Jannifer Hick, MD;  Location: [facility];  Service: Urology;  Laterality: N/A;   TONSILLECTOMY     as a child    REVIEW OF SYSTEMS:   Review of Systems  Constitutional: Negative for appetite change, chills, fatigue,  fever and unexpected weight change.  HENT: Negative for mouth sores, nosebleeds, sore throat and trouble swallowing.   Eyes: Negative for eye problems and icterus.  Respiratory: Negative for cough, hemoptysis, shortness of breath and wheezing.   Cardiovascular: Negative for chest pain and leg swelling.  Gastrointestinal: Negative for abdominal pain, constipation, diarrhea, nausea and vomiting.  Genitourinary: Negative for bladder incontinence, difficulty urinating, dysuria, frequency and hematuria.   Musculoskeletal: Negative for back pain, gait problem, neck pain and neck stiffness.  Skin: Negative for itching and rash.  Neurological: Negative for dizziness, extremity weakness, gait problem, headaches, light-headedness and seizures.  Hematological:  Negative for adenopathy. Does not bruise/bleed easily.  Psychiatric/Behavioral: Negative for confusion, depression and sleep disturbance. The patient is not nervous/anxious.     PHYSICAL EXAMINATION:  Blood pressure 135/72, pulse 61, temperature 97.7 F (36.5 C), temperature source Oral, resp. rate 13, weight 163 lb 6.4 oz (74.1 kg), SpO2 96 %.  ECOG PERFORMANCE STATUS: 0  Physical Exam  Constitutional: Oriented to person, place, and time and well-developed, well-nourished, and in no distress.  HENT:  Head: Normocephalic and atraumatic.  Mouth/Throat: Oropharynx is clear and moist. No oropharyngeal exudate.  Eyes: Conjunctivae are normal. Right eye exhibits no discharge. Left eye exhibits no discharge. No scleral icterus.  Neck: Normal range of motion. Neck supple.  Cardiovascular: Normal rate, regular rhythm, normal heart sounds and intact distal pulses.   Pulmonary/Chest: Effort normal and breath sounds normal. No respiratory distress. No wheezes. No rales.  Abdominal: Soft. Bowel sounds are normal. Exhibits no distension and no mass. There is no tenderness.  Musculoskeletal: Normal range of motion. Exhibits no edema.  Lymphadenopathy:    No cervical adenopathy.  Neurological: Alert and oriented to person, place, and time. Exhibits normal muscle tone. Gait normal. Coordination normal.  Skin: Skin is warm and dry. No rash noted. Not diaphoretic. No erythema. No pallor.  Psychiatric: Mood, memory and judgment normal.  Vitals reviewed.  LABORATORY DATA: Lab Results  Component Value Date   WBC 4.7 05/05/2022   HGB 14.6 05/05/2022   HCT 42.3 05/05/2022   MCV 93.2 05/05/2022   PLT 165 05/05/2022      Chemistry      Component Value Date/Time   NA 140 05/05/2022 1351   K 4.6 05/05/2022 1351   CL 105 05/05/2022 1351   CO2 29 05/05/2022 1351   BUN 16 05/05/2022 1351   CREATININE 0.97 05/05/2022 1351      Component Value Date/Time   CALCIUM 9.3 05/05/2022 1351   ALKPHOS 77  05/05/2022 1351   AST 19 05/05/2022 1351   ALT 20 05/05/2022 1351   BILITOT 0.7 05/05/2022 1351       RADIOGRAPHIC STUDIES:  No results found.   ASSESSMENT/PLAN:  Chad Ford is a 82 y.o. male with    Malignant neoplasm of prostate (HCC) -diagnosed 08/2019, s/p brachytherapy on 10/28/19 under Dr. Cardell Peach and Dr. Kathrynn Running -PSA f/u with Dr. Cardell Peach, low per pt.   Primary appendiceal adenocarcinoma (HCC) -pT4N0M0, stage II   -diagnosed in 08/2020 --s/p right hemicolectomy on 01/31/21 with Dr. Cliffton Asters, pathology from colon and all 26 lymph nodes was negative. -he is on cancer surveillance. -restaging CT AP 01/01/23 showed NED.  -Labs were reviewed which are unremarkable. CEA is pending -He is doing well today. I will order is next restating CT scan which is due in a few months. I will arrange a follow up a few days after that.  -We will reach out to Dr.  Hung office to obtain a copy of his recent colonoscopy.      PLAN: -lab reviewed. He is clinically doing well, no concerns.  -f/u and CT Scan in 3 months -CEA pending. If there are any concerns, we will call him sooner   Orders Placed This Encounter  Procedures   CT CHEST ABDOMEN PELVIS W CONTRAST    Standing Status:   Future    Standing Expiration Date:   05/05/2023    Order Specific Question:   If indicated for the ordered procedure, I authorize the administration of contrast media per Radiology protocol    Answer:   Yes    Order Specific Question:   Does the patient have a contrast media/X-ray dye allergy?    Answer:   No    Order Specific Question:   Preferred imaging location?    Answer:   [facility]    Order Specific Question:   If indicated for the ordered procedure, I authorize the administration of oral contrast media per Radiology protocol    Answer:   Yes   CBC with Differential (Cancer Center Only)    Standing Status:   Future    Standing Expiration Date:   05/05/2023   CMP (Cancer Center only)    Standing  Status:   Future    Standing Expiration Date:   05/05/2023   CEA (Access)-[facility] ONLY    Standing Status:   Future    Standing Expiration Date:   05/05/2023     The total time spent in the appointment was 20-29 minutes.   Kilynn Fitzsimmons L Finnegan Gatta, PA-C 05/05/22

## 2022-05-05 ENCOUNTER — Inpatient Hospital Stay: Payer: PPO | Attending: Physician Assistant

## 2022-05-05 ENCOUNTER — Inpatient Hospital Stay (HOSPITAL_BASED_OUTPATIENT_CLINIC_OR_DEPARTMENT_OTHER): Payer: PPO | Admitting: Physician Assistant

## 2022-05-05 VITALS — BP 135/72 | HR 61 | Temp 97.7°F | Resp 13 | Wt 163.4 lb

## 2022-05-05 DIAGNOSIS — Z8546 Personal history of malignant neoplasm of prostate: Secondary | ICD-10-CM | POA: Diagnosis not present

## 2022-05-05 DIAGNOSIS — Z923 Personal history of irradiation: Secondary | ICD-10-CM | POA: Diagnosis not present

## 2022-05-05 DIAGNOSIS — C181 Malignant neoplasm of appendix: Secondary | ICD-10-CM | POA: Insufficient documentation

## 2022-05-05 DIAGNOSIS — Z79899 Other long term (current) drug therapy: Secondary | ICD-10-CM | POA: Diagnosis not present

## 2022-05-05 DIAGNOSIS — Z9049 Acquired absence of other specified parts of digestive tract: Secondary | ICD-10-CM | POA: Insufficient documentation

## 2022-05-05 LAB — CBC WITH DIFFERENTIAL (CANCER CENTER ONLY)
Abs Immature Granulocytes: 0.01 10*3/uL (ref 0.00–0.07)
Basophils Absolute: 0 10*3/uL (ref 0.0–0.1)
Basophils Relative: 1 %
Eosinophils Absolute: 0.1 10*3/uL (ref 0.0–0.5)
Eosinophils Relative: 1 %
HCT: 42.3 % (ref 39.0–52.0)
Hemoglobin: 14.6 g/dL (ref 13.0–17.0)
Immature Granulocytes: 0 %
Lymphocytes Relative: 11 %
Lymphs Abs: 0.5 10*3/uL — ABNORMAL LOW (ref 0.7–4.0)
MCH: 32.2 pg (ref 26.0–34.0)
MCHC: 34.5 g/dL (ref 30.0–36.0)
MCV: 93.2 fL (ref 80.0–100.0)
Monocytes Absolute: 0.4 10*3/uL (ref 0.1–1.0)
Monocytes Relative: 9 %
Neutro Abs: 3.6 10*3/uL (ref 1.7–7.7)
Neutrophils Relative %: 78 %
Platelet Count: 165 10*3/uL (ref 150–400)
RBC: 4.54 MIL/uL (ref 4.22–5.81)
RDW: 13 % (ref 11.5–15.5)
WBC Count: 4.7 10*3/uL (ref 4.0–10.5)
nRBC: 0 % (ref 0.0–0.2)

## 2022-05-05 LAB — CEA (IN HOUSE-CHCC): CEA (CHCC-In House): 3.06 ng/mL (ref 0.00–5.00)

## 2022-05-05 LAB — CMP (CANCER CENTER ONLY)
ALT: 20 U/L (ref 0–44)
AST: 19 U/L (ref 15–41)
Albumin: 4.1 g/dL (ref 3.5–5.0)
Alkaline Phosphatase: 77 U/L (ref 38–126)
Anion gap: 6 (ref 5–15)
BUN: 16 mg/dL (ref 8–23)
CO2: 29 mmol/L (ref 22–32)
Calcium: 9.3 mg/dL (ref 8.9–10.3)
Chloride: 105 mmol/L (ref 98–111)
Creatinine: 0.97 mg/dL (ref 0.61–1.24)
GFR, Estimated: >60 mL/min (ref >60)
Glucose, Bld: 99 mg/dL (ref 70–99)
Potassium: 4.6 mmol/L (ref 3.5–5.1)
Sodium: 140 mmol/L (ref 135–145)
Total Bilirubin: 0.7 mg/dL (ref 0.3–1.2)
Total Protein: 7 g/dL (ref 6.5–8.1)

## 2022-08-04 ENCOUNTER — Inpatient Hospital Stay: Payer: PPO | Attending: Hematology

## 2022-08-04 DIAGNOSIS — C181 Malignant neoplasm of appendix: Secondary | ICD-10-CM

## 2022-08-04 DIAGNOSIS — Z923 Personal history of irradiation: Secondary | ICD-10-CM | POA: Diagnosis not present

## 2022-08-04 DIAGNOSIS — Z79899 Other long term (current) drug therapy: Secondary | ICD-10-CM | POA: Diagnosis not present

## 2022-08-04 DIAGNOSIS — Z85038 Personal history of other malignant neoplasm of large intestine: Secondary | ICD-10-CM | POA: Insufficient documentation

## 2022-08-04 DIAGNOSIS — Z8546 Personal history of malignant neoplasm of prostate: Secondary | ICD-10-CM | POA: Diagnosis present

## 2022-08-04 LAB — CMP (CANCER CENTER ONLY)
ALT: 19 U/L (ref 0–44)
AST: 22 U/L (ref 15–41)
Albumin: 4 g/dL (ref 3.5–5.0)
Alkaline Phosphatase: 67 U/L (ref 38–126)
Anion gap: 7 (ref 5–15)
BUN: 22 mg/dL (ref 8–23)
CO2: 26 mmol/L (ref 22–32)
Calcium: 9 mg/dL (ref 8.9–10.3)
Chloride: 106 mmol/L (ref 98–111)
Creatinine: 1.1 mg/dL (ref 0.61–1.24)
GFR, Estimated: >60 mL/min (ref >60)
Glucose, Bld: 87 mg/dL (ref 70–99)
Potassium: 4 mmol/L (ref 3.5–5.1)
Sodium: 139 mmol/L (ref 135–145)
Total Bilirubin: 0.9 mg/dL (ref 0.3–1.2)
Total Protein: 6.5 g/dL (ref 6.5–8.1)

## 2022-08-04 LAB — CBC WITH DIFFERENTIAL (CANCER CENTER ONLY)
Abs Immature Granulocytes: 0.02 10*3/uL (ref 0.00–0.07)
Basophils Absolute: 0 10*3/uL (ref 0.0–0.1)
Basophils Relative: 1 %
Eosinophils Absolute: 0.1 10*3/uL (ref 0.0–0.5)
Eosinophils Relative: 2 %
HCT: 39.6 % (ref 39.0–52.0)
Hemoglobin: 14 g/dL (ref 13.0–17.0)
Immature Granulocytes: 1 %
Lymphocytes Relative: 15 %
Lymphs Abs: 0.6 10*3/uL — ABNORMAL LOW (ref 0.7–4.0)
MCH: 32.9 pg (ref 26.0–34.0)
MCHC: 35.4 g/dL (ref 30.0–36.0)
MCV: 93 fL (ref 80.0–100.0)
Monocytes Absolute: 0.4 10*3/uL (ref 0.1–1.0)
Monocytes Relative: 10 %
Neutro Abs: 3 10*3/uL (ref 1.7–7.7)
Neutrophils Relative %: 71 %
Platelet Count: 143 10*3/uL — ABNORMAL LOW (ref 150–400)
RBC: 4.26 MIL/uL (ref 4.22–5.81)
RDW: 12.7 % (ref 11.5–15.5)
WBC Count: 4.2 10*3/uL (ref 4.0–10.5)
nRBC: 0 % (ref 0.0–0.2)

## 2022-08-04 LAB — CEA (ACCESS): CEA (CHCC): 2.36 ng/mL (ref 0.00–5.00)

## 2022-08-05 ENCOUNTER — Ambulatory Visit (HOSPITAL_COMMUNITY)
Admission: RE | Admit: 2022-08-05 | Discharge: 2022-08-05 | Disposition: A | Discharge status: Home / Self Care | Payer: PPO | Source: Ambulatory Visit | Attending: Physician Assistant | Admitting: Physician Assistant

## 2022-08-05 DIAGNOSIS — C181 Malignant neoplasm of appendix: Secondary | ICD-10-CM | POA: Diagnosis not present

## 2022-08-05 MED ORDER — IOHEXOL 300 MG/ML  SOLN
100.0000 mL | Freq: Once | INTRAMUSCULAR | Status: AC | PRN
  Administered 2022-08-05: 100 mL via INTRAVENOUS

## 2022-08-05 MED ORDER — IOHEXOL 9 MG/ML PO SOLN
1000.0000 mL | ORAL | Status: AC
  Administered 2022-08-05: 1000 mL via ORAL

## 2022-08-05 MED ORDER — IOHEXOL 9 MG/ML PO SOLN
ORAL | Status: AC
  Filled 2022-08-05: qty 1000

## 2022-08-05 MED ORDER — SODIUM CHLORIDE (PF) 0.9 % IJ SOLN
INTRAMUSCULAR | Status: AC
  Filled 2022-08-05: qty 50

## 2022-08-05 NOTE — Progress Notes (Unsigned)
[facility]   [phone] [phone]   Clinic Follow up Note   Patient Care Team: [name] as PCP - General (Family Medicine) Derenda Mis, PA-C (Inactive) (Dermatology)  Date of Service:  08/06/2022  CHIEF COMPLAINT: f/u of appendiceal cancer   CURRENT THERAPY:  Surveillance   ASSESSMENT:  Chad Ford is a 82 y.o. male with   Primary appendiceal adenocarcinoma (HCC) -pT4N0M0, stage II   -diagnosed in 08/2020 --s/p right hemicolectomy on 01/31/21 with Dr. Cliffton Asters, pathology from colon and all 26 lymph nodes was negative. -he is on cancer surveillance. -restaging CT AP 06/28/21 showed NED.  -He is clinically doing very well, asymptomatic, exam was unremarkable.  There is no clinical concern for recurrence. -He had a repeated surveillance CT scan yesterday, is not right when I saw the patient.  I personally reviewed the scan images, no evidence of recurrence to my eyes.  I will call patient if the report is different from my reading.  Malignant neoplasm of prostate [facility]) -diagnosed 08/2019, s/p brachytherapy on 10/28/19 under Dr. Cardell Peach and Dr. Kathrynn Running -PSA f/u with Dr. Cardell Peach, low per pt.     PLAN: - Ct scan is still pending, but I reviewed it personally with the pt. -Lab reviewed -Tumor Marker-Good -lab and f/u in 6 months with NP  SUMMARY OF ONCOLOGIC HISTORY: Oncology History Overview Note   Cancer Staging  Malignant neoplasm of prostate [facility]) Staging form: Prostate, AJCC 8th Edition - Clinical stage from 07/22/2019: Stage IIC (cT1c, cN0, cM0, PSA: 11.5, Grade Group: 3) - Signed by Marcello Fennel, PA-C on 08/26/2019 Histopathologic type: Adenocarcinoma, NOS Stage prefix: Initial diagnosis Prostate specific antigen (PSA) range: 10 to 19 Gleason primary pattern: 4 Gleason secondary pattern: 3 Gleason score: 7 Histologic grading system: 5 grade system Number of biopsy cores examined: 12 Number of biopsy cores  positive: 2 Location of positive needle core biopsies: One side  Primary appendiceal adenocarcinoma (HCC) Staging form: Appendix, AJCC V9 - Pathologic stage from 09/04/2020: pT4, pN0, cM0, G2 - Unsigned Total positive nodes: 0 Histologic grading system: 3 grade system Residual tumor (R): R0 - None     Malignant neoplasm of prostate (HCC)  07/22/2019 Cancer Staging   Staging form: Prostate, AJCC 8th Edition - Clinical stage from 07/22/2019: Stage IIC (cT1c, cN0, cM0, PSA: 11.5, Grade Group: 3) - Signed by Marcello Fennel, PA-C on 08/26/2019   08/26/2019 Initial Diagnosis   Malignant neoplasm of prostate (HCC)   06/28/2021 Imaging    IMPRESSION: 1. No evidence of recurrent or metastatic disease in the abdomen or pelvis. 2. Stable small cystic pancreatic lesions better evaluated on prior MRI December 21, 2020. Per that studies recommendations recommend follow-up MRI/MRCP with and without contrast in 2 years from that examination. 3. Nonobstructive 6 mm left lower pole renal stone. 4. Moderate volume of formed stool throughout the colon suggestive of constipation.   12/31/2021 Imaging    IMPRESSION: 1. No evidence of local recurrence or metastatic disease within the chest, abdomen, or pelvis. 2. Large volume of formed stool in the colon suggestive of constipation. 3. 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 4. Mild symmetric esophageal wall thickening, correlate for symptoms of esophagitis. 5. Nonobstructive 6 mm left lower pole renal stone. 6.  Aortic Atherosclerosis (ICD10-I70.0).   Primary appendiceal adenocarcinoma (HCC)  09/03/2020 Imaging   EXAM:  CT ABDOMEN AND PELVIS WITH CONTRAST  IMPRESSION: 1. Acute appendicitis. No abscess or perforation. 2. Sigmoid diverticulosis. 3. Indeterminate 5 mm low attenuating/cystic lesion  involving the posterior aspect of the uncinate process of the pancreas. This can be better characterized with MRI on a nonemergent/outpatient basis. 4. Aortic Atherosclerosis (ICD10-I70.0).   09/04/2020 Pathology Results   FINAL MICROSCOPIC DIAGNOSIS:   A. APPENDIX, APPENDECTOMY:  -  Adenocarcinoma with neuroendocrine features, moderately  differentiated  -  Proximal margin uninvolved by adenocarcinoma  -  See oncology table below    10/02/2020 Imaging   EXAM: CT CHEST WITH CONTRAST  IMPRESSION: Small sliding-type hiatal hernia.   No other significant abnormality seen in the chest.   12/21/2020 Imaging   EXAM: MRI ABDOMEN WITHOUT AND WITH CONTRAST  IMPRESSION: 1. 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 The largest of these measures up to 1.3 x 0.8 cm in the posterior aspect of the pancreatic head. Repeat abdominal MRI with and without IV gadolinium with MRCP is recommended in 2 years to ensure stability of these findings. This recommendation follows ACR consensus guidelines: Management of Incidental Pancreatic Cysts: A White Paper of the ACR Incidental Findings Committee. J Am Coll Radiol 2017;14:911-923. 2. Mild left-sided hydronephrosis, suggestive of potential left UPJ obstruction. Further clinical evaluation is recommended. 3. Aortic atherosclerosis. 4. Additional incidental findings, as above.   01/31/2021 Pathology Results   FINAL MICROSCOPIC DIAGNOSIS:   A. COLON, RIGHT, HEMI-COLECTOMY:  -  Benign colon  -  No carcinoma identified in twenty-six lymph nodes(0/26)    02/27/2021 Initial Diagnosis   Primary appendiceal adenocarcinoma (HCC)   12/31/2021 Imaging    IMPRESSION: 1. No evidence of local recurrence or metastatic disease within the chest, abdomen, or pelvis. 2. Large volume of formed stool in the colon suggestive of constipation. 3. 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 4. Mild symmetric esophageal wall thickening, correlate for symptoms of esophagitis. 5. Nonobstructive 6 mm left lower pole renal stone. 6.  Aortic Atherosclerosis (ICD10-I70      INTERVAL HISTORY:  Chad Ford is here for a follow up of appendiceal cancer . He was last seen by me on 05/05/2022. He presents to the clinic alone. Pt state that he is doing well. Pt has no clinical issues.     All other systems were reviewed with the patient and are negative.  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   Left Upper Forehead (excision)   Prostate cancer (HCC)    Psoriasis    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   Right Forearm (Keratoacanthoma) (curet and 5FU)   Superficial basal cell carcinoma (BCC) 06/16/2019   Left Upper Arm - Anterior  Vertigo    treats with specific exercises    SURGICAL HISTORY: Past Surgical History:  Procedure Laterality Date   APPENDECTOMY  09/04/2020   CIRCUMCISION     as a child   COLON SURGERY  01/31/2021   COLONOSCOPY     COLONOSCOPY W/ POLYPECTOMY     HEMORRHOID SURGERY  2000   HERNIA REPAIR Right 2002   Inguinal   INGUINAL HERNIA REPAIR Left 12/31/2015   Procedure: HERNIA REPAIR INGUINAL ADULT;  Surgeon: Violeta Gelinas, MD;  Location: [facility] OR;  Service: General;   Laterality: Left;   INSERTION OF MESH Left 12/31/2015   Procedure: INSERTION OF MESH;  Surgeon: Violeta Gelinas, MD;  Location: [facility] OR;  Service: General;  Laterality: Left;   LAPAROSCOPIC APPENDECTOMY N/A 09/04/2020   Procedure: APPENDECTOMY LAPAROSCOPIC;  Surgeon: Harriette Bouillon, MD;  Location: [facility] OR;  Service: General;  Laterality: N/A;   LAPAROSCOPIC RIGHT HEMI COLECTOMY Right 01/31/2021   Procedure: LAPAROSCOPIC RIGHT HEMI COLECTOMY WITH TAPPS BLOCK;  Surgeon: Andria Meuse, MD;  Location: [facility];  Service: General;  Laterality: Right;   RADIOACTIVE SEED IMPLANT N/A 10/28/2019   Procedure: RADIOACTIVE SEED IMPLANT/BRACHYTHERAPY IMPLANT;  Surgeon: Jannifer Hick, MD;  Location: [facility];  Service: Urology;  Laterality: N/A;   SPACE OAR INSTILLATION N/A 10/28/2019   Procedure: SPACE OAR INSTILLATION;  Surgeon: Jannifer Hick, MD;  Location: [facility];  Service: Urology;  Laterality: N/A;   TONSILLECTOMY     as a child    I have reviewed the social history and family history with the patient and they are unchanged from previous note.  ALLERGIES:  is allergic to penicillins.  MEDICATIONS:  Current Outpatient Medications  Medication Sig Dispense Refill   amLODipine (NORVASC) 5 MG tablet Take 5 mg by mouth daily.     benazepril (LOTENSIN) 40 MG tablet Take 40 mg by mouth at bedtime.     ciclopirox (LOPROX) 0.77 % cream Apply 1 application topically See admin instructions. Apply to feet every other nite 90 g 2   doxazosin (CARDURA) 8 MG tablet Take 8 mg by mouth at bedtime.     naproxen sodium (ALEVE) 220 MG tablet Take 220 mg by mouth 2 (two) times daily as needed (pain/headache/fever).     pravastatin (PRAVACHOL) 40 MG tablet Take 40 mg by mouth at bedtime.     No current facility-administered medications for this visit.    PHYSICAL EXAMINATION: ECOG PERFORMANCE STATUS: 0 - Asymptomatic  Vitals:   08/06/22 1412  BP: 124/64  Pulse: (!) 56   Resp: 17  Temp: 97.7 F (36.5 C)  SpO2: 97%   Wt Readings from Last 3 Encounters:  08/06/22 155 lb 3.2 oz (70.4 kg)  05/05/22 163 lb 6.4 oz (74.1 kg)  01/01/22 160 lb 4.8 oz (72.7 kg)     GENERAL:alert, no distress and comfortable SKIN: skin color normal, no rashes or significant lesions EYES: normal, Conjunctiva are pink and non-injected, sclera clear  NEURO: alert & oriented x 3 with fluent speech  LABORATORY DATA:  I have reviewed the data as listed    Latest Ref Rng & Units 08/04/2022   10:04 AM 05/05/2022    1:51 PM 12/30/2021   10:47 AM  CBC  WBC 4.0 - 10.5 K/uL 4.2  4.7  4.6   Hemoglobin 13.0 - 17.0 g/dL 10.2  72.5  36.6   Hematocrit 39.0 - 52.0 % 39.6  42.3  40.4   Platelets 150 - 400  K/uL 143  165  164         Latest Ref Rng & Units 08/04/2022   10:04 AM 05/05/2022    1:51 PM 12/30/2021   10:47 AM  CMP  Glucose 70 - 99 mg/dL 87  99  147   BUN 8 - 23 mg/dL 22  16  16    Creatinine 0.61 - 1.24 mg/dL 8.29  5.62  1.30   Sodium 135 - 145 mmol/L 139  140  139   Potassium 3.5 - 5.1 mmol/L 4.0  4.6  3.9   Chloride 98 - 111 mmol/L 106  105  108   CO2 22 - 32 mmol/L 26  29  27    Calcium 8.9 - 10.3 mg/dL 9.0  9.3  8.9   Total Protein 6.5 - 8.1 g/dL 6.5  7.0  6.4   Total Bilirubin 0.3 - 1.2 mg/dL 0.9  0.7  0.7   Alkaline Phos 38 - 126 U/L 67  77  76   AST 15 - 41 U/L 22  19  19    ALT 0 - 44 U/L 19  20  18        RADIOGRAPHIC STUDIES: I have personally reviewed the radiological images as listed and agreed with the findings in the report. No results found.    No orders of the defined types were placed in this encounter.  All questions were answered. The patient knows to call the clinic with any problems, questions or concerns. No barriers to learning was detected. The total time spent in the appointment was 25 minutes.     Malachy Mood, MD 08/06/2022   Carolin Coy, CMA, am acting as scribe for Malachy Mood, MD.   I have reviewed the above documentation for  accuracy and completeness, and I agree with the above.

## 2022-08-06 ENCOUNTER — Inpatient Hospital Stay: Payer: PPO | Admitting: Hematology

## 2022-08-06 ENCOUNTER — Encounter: Payer: Self-pay | Admitting: Hematology

## 2022-08-06 ENCOUNTER — Other Ambulatory Visit: Payer: Self-pay

## 2022-08-06 VITALS — BP 124/64 | HR 56 | Temp 97.7°F | Resp 17 | Wt 155.2 lb

## 2022-08-06 DIAGNOSIS — Z85038 Personal history of other malignant neoplasm of large intestine: Secondary | ICD-10-CM | POA: Diagnosis not present

## 2022-08-06 DIAGNOSIS — C61 Malignant neoplasm of prostate: Secondary | ICD-10-CM

## 2022-08-06 DIAGNOSIS — C181 Malignant neoplasm of appendix: Secondary | ICD-10-CM | POA: Diagnosis not present

## 2022-08-06 NOTE — Assessment & Plan Note (Signed)
-  pT4N0M0, stage II   -diagnosed in 08/2020 --s/p right hemicolectomy on 01/31/21 with Dr. Dema Severin, pathology from colon and all 26 lymph nodes was negative. -he is on cancer surveillance. -restaging CT AP 06/28/21 showed NED.

## 2022-08-06 NOTE — Assessment & Plan Note (Signed)
-  diagnosed 08/2019, s/p brachytherapy on 10/28/19 under Dr. Abner Greenspan and Dr. Tammi Klippel -PSA f/u with Dr. Abner Greenspan, low per pt.

## 2022-08-07 ENCOUNTER — Telehealth: Payer: Self-pay

## 2022-08-07 ENCOUNTER — Telehealth: Payer: Self-pay | Admitting: Hematology

## 2022-08-07 NOTE — Telephone Encounter (Addendum)
Called patient and relayed message below as per Dr. Mosetta Putt, patient voiced full understanding. He also stated he has had 2 colonoscopy's since his surgery.    ----- Message from Malachy Mood sent at 08/07/2022  8:02 AM EDT ----- Please let patient know the CT scan formal report, I saw him yesterday, but the result was not back.  No definitive evidence of metastasis.  Please make sure he had repeated colonoscopy in the past 2 years since his surgery.  Malachy Mood  08/07/2022

## 2022-08-13 IMAGING — CT CT ABD-PELV W/ CM
3 of 5 series · 10 of 46 positions shown, 15 images · IV contrast (ISOVUE 300)
Comparison: MRI December 21, 2020 and CT September 03, 2020

CLINICAL DATA: History of primary appendiceal adenocarcinoma,
assess for recurrence. * Tracking Code: BO *.

EXAM:
CT ABDOMEN AND PELVIS WITH CONTRAST
TECHNIQUE: Multidetector CT imaging of the abdomen and pelvis was performed
using the standard protocol following bolus administration of
intravenous contrast.

[Series 2: axial st · axial · 0.96mm/px · z∈[-397,-52]mm · 6 of 97 slices shown, 11 images]
[im 14/97  soft-tissue]
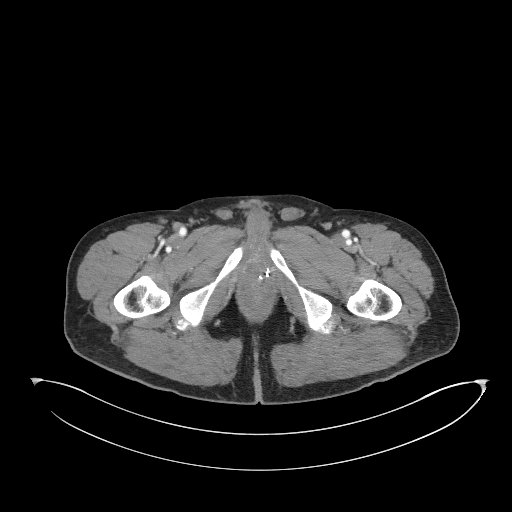
[im 14/97  bone]
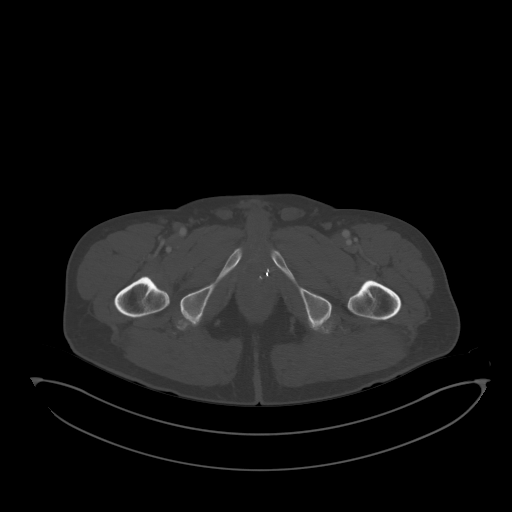
[im 28/97  soft-tissue]
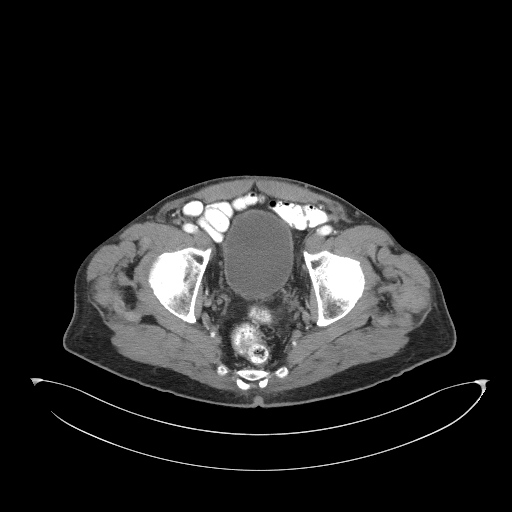
[im 42/97  soft-tissue]
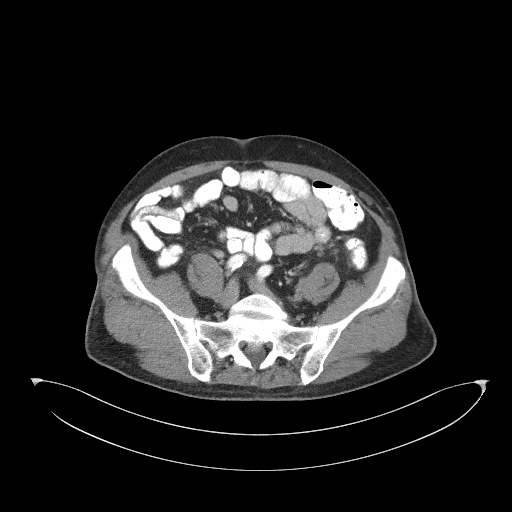
[im 42/97  lung]
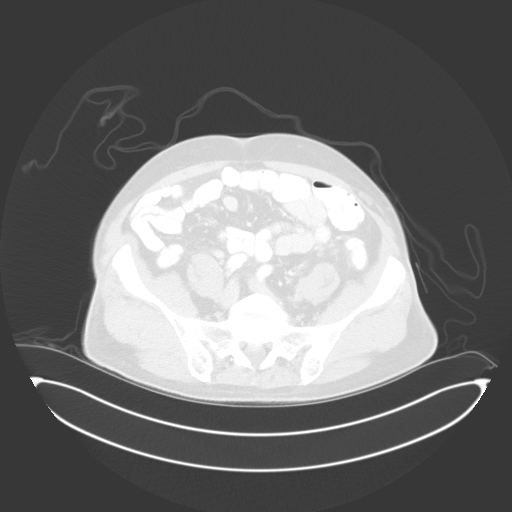
[im 55/97  soft-tissue]
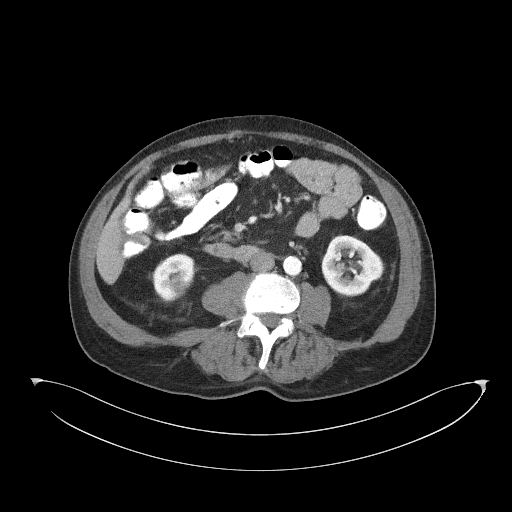
[im 55/97  lung]
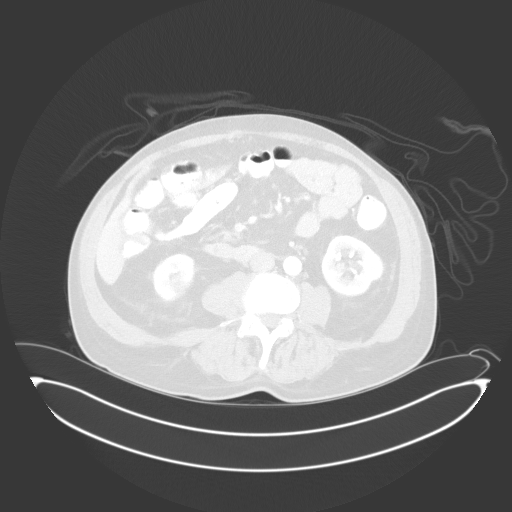
[im 69/97  soft-tissue]
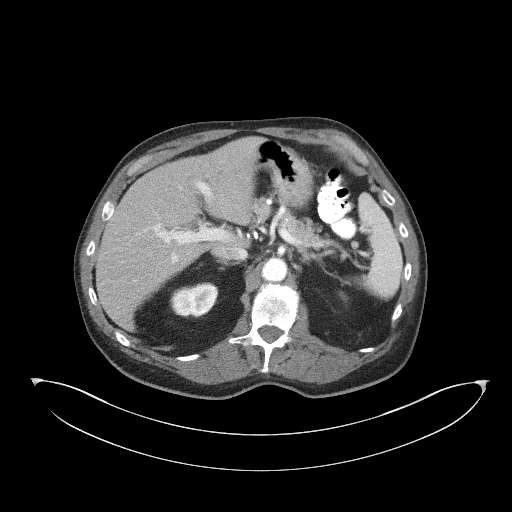
[im 69/97  lung]
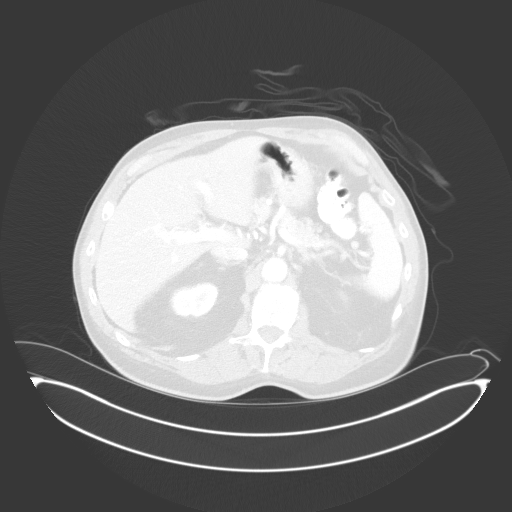
[im 83/97  soft-tissue]
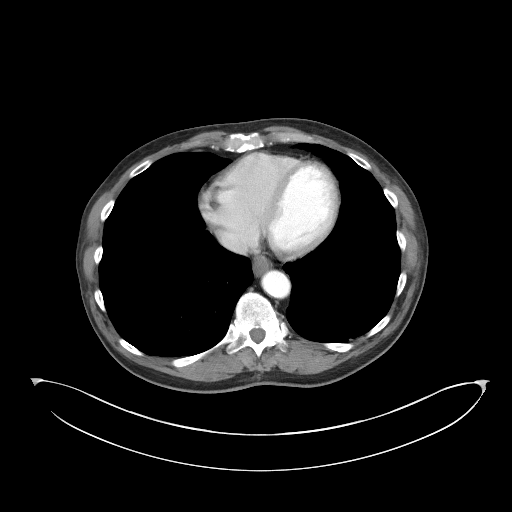
[im 83/97  lung]
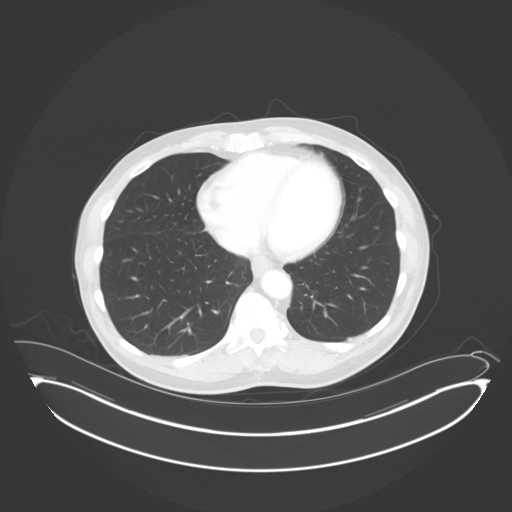

[Series 5: coronal st · coronal · 0.75mm/px · 3 of 95 slices shown]
[im 32/95  soft-tissue]
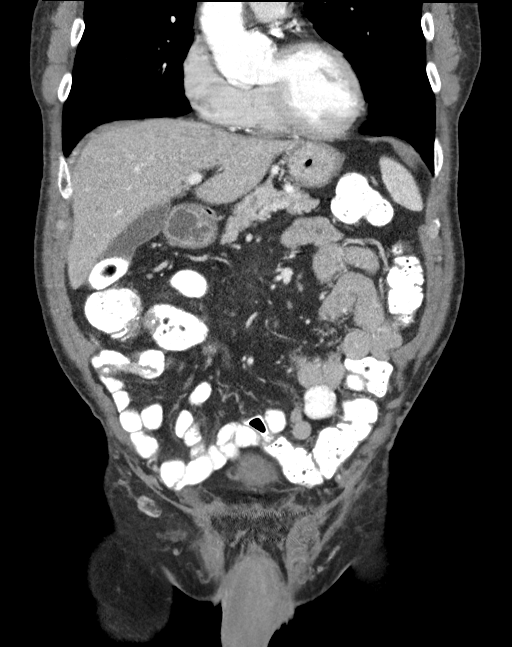
[im 42/95  soft-tissue]
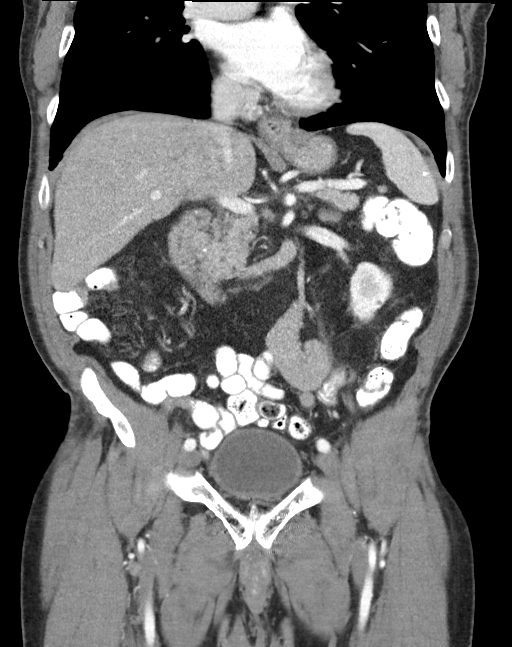
[im 53/95  soft-tissue]
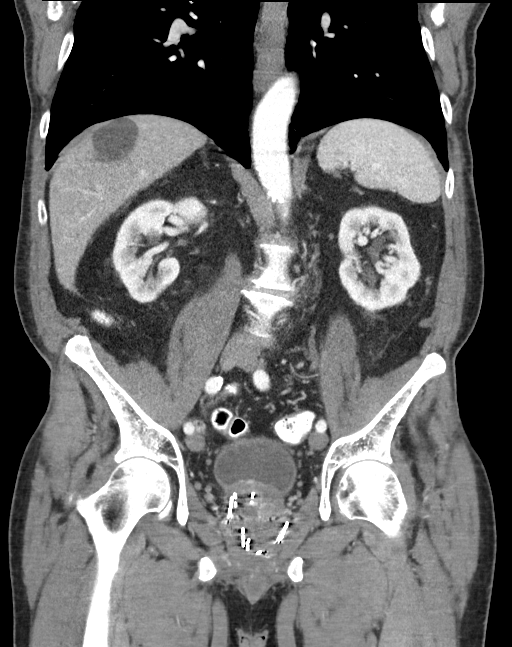

[Series 6: sagittal st · sagittal · 0.55mm/px · 1 of 127 slices shown]
[im 43/127  soft-tissue]
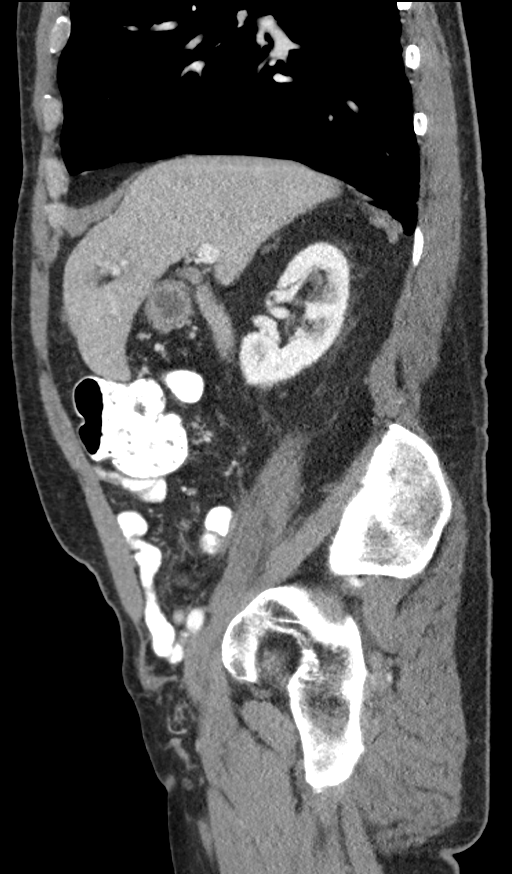

[10 of 46 positions shown; findings below may reference images not displayed]

RADIATION DOSE REDUCTION: This exam was performed according to the
departmental dose-optimization program which includes automated
exposure control, adjustment of the mA and/or kV according to
patient size and/or use of iterative reconstruction technique.

CONTRAST:  100mL OMNIPAQUE IOHEXOL 300 MG/ML  SOLN
FINDINGS: Lower chest: No acute abnormality.

Hepatobiliary: Hypodense bilobar hepatic lesions measure up to
cm in the right lobe of the liver on image [DATE], previously
characterized as benign hepatic cysts on MRI December 21, 2020. No
suspicious hepatic lesion. Gallbladder is unremarkable. No biliary
ductal dilation.

Pancreas: No pancreatic ductal dilation. Stable small pancreatic
cystic lesions measuring up to 11 mm in the pancreatic uncinate
process on image 35/2 are better evaluated on MRI December 21, 2020
and most likely reflecting pancreatic pseudocysts or areas of side
branch ectasia. Recommend follow-up MRI/MRCP with and without
contrast in 2 years from December 21, 2020 further recommendations of
that examination.

Spleen: Calcified splenic granulomata.  No splenomegaly.

Adrenals/Urinary Tract: Bilateral adrenal glands appear normal. No
hydronephrosis. Nonobstructive left lower pole renal stone 6 mm.
Bilateral cortical renal scarring. No suspicious renal lesion.

Stomach/Bowel: Radiopaque enteric contrast material traverses the
rectum. Small hiatal hernia otherwise the stomach is unremarkable
for degree of distension. No pathologic dilation of small or large
bowel. The appendix is surgically absent. Terminal ileum appears
normal. No evidence of acute bowel inflammation. Moderate volume of
formed stool throughout the colon suggestive of constipation.

Vascular/Lymphatic: Aortic and branch vessel atherosclerosis without
aneurysmal dilation. No pathologically enlarged abdominal or pelvic
lymph nodes.

Reproductive: Brachytherapy seeds in an enlarged prostate gland.

Other: No significant abdominopelvic free fluid. No discrete
peritoneal or omental nodularity. Postsurgical change in the
abdominal wall.

Musculoskeletal: No aggressive lytic or blastic lesion of bone.
Levoconvex curvature of the lumbar spine. Multilevel degenerative
changes spine.
IMPRESSION: 1. No evidence of recurrent or metastatic disease in the abdomen or
pelvis.
2. Stable small cystic pancreatic lesions better evaluated on prior
MRI December 21, 2020. Per that studies recommendations recommend
follow-up MRI/MRCP with and without contrast in 2 years from that
examination.
3. Nonobstructive 6 mm left lower pole renal stone.
4. Moderate volume of formed stool throughout the colon suggestive
of constipation.

## 2023-02-06 ENCOUNTER — Other Ambulatory Visit: Payer: Self-pay

## 2023-02-06 DIAGNOSIS — C61 Malignant neoplasm of prostate: Secondary | ICD-10-CM

## 2023-02-06 DIAGNOSIS — C181 Malignant neoplasm of appendix: Secondary | ICD-10-CM

## 2023-02-09 ENCOUNTER — Inpatient Hospital Stay: Payer: PPO | Attending: Nurse Practitioner

## 2023-02-09 ENCOUNTER — Encounter: Payer: Self-pay | Admitting: Nurse Practitioner

## 2023-02-09 ENCOUNTER — Inpatient Hospital Stay (HOSPITAL_BASED_OUTPATIENT_CLINIC_OR_DEPARTMENT_OTHER): Payer: PPO | Admitting: Nurse Practitioner

## 2023-02-09 VITALS — BP 128/67 | HR 63 | Temp 98.4°F | Resp 18 | Wt 164.1 lb

## 2023-02-09 DIAGNOSIS — C181 Malignant neoplasm of appendix: Secondary | ICD-10-CM

## 2023-02-09 DIAGNOSIS — Z8546 Personal history of malignant neoplasm of prostate: Secondary | ICD-10-CM | POA: Insufficient documentation

## 2023-02-09 DIAGNOSIS — C61 Malignant neoplasm of prostate: Secondary | ICD-10-CM

## 2023-02-09 DIAGNOSIS — Z85038 Personal history of other malignant neoplasm of large intestine: Secondary | ICD-10-CM | POA: Insufficient documentation

## 2023-02-09 LAB — CBC WITH DIFFERENTIAL (CANCER CENTER ONLY)
Abs Immature Granulocytes: 0.01 10*3/uL (ref 0.00–0.07)
Basophils Absolute: 0 10*3/uL (ref 0.0–0.1)
Basophils Relative: 1 %
Eosinophils Absolute: 0 10*3/uL (ref 0.0–0.5)
Eosinophils Relative: 1 %
HCT: 43.1 % (ref 39.0–52.0)
Hemoglobin: 14.7 g/dL (ref 13.0–17.0)
Immature Granulocytes: 0 %
Lymphocytes Relative: 10 %
Lymphs Abs: 0.5 10*3/uL — ABNORMAL LOW (ref 0.7–4.0)
MCH: 31.3 pg (ref 26.0–34.0)
MCHC: 34.1 g/dL (ref 30.0–36.0)
MCV: 91.9 fL (ref 80.0–100.0)
Monocytes Absolute: 0.3 10*3/uL (ref 0.1–1.0)
Monocytes Relative: 7 %
Neutro Abs: 3.7 10*3/uL (ref 1.7–7.7)
Neutrophils Relative %: 81 %
Platelet Count: 156 10*3/uL (ref 150–400)
RBC: 4.69 MIL/uL (ref 4.22–5.81)
RDW: 12.8 % (ref 11.5–15.5)
WBC Count: 4.5 10*3/uL (ref 4.0–10.5)
nRBC: 0 % (ref 0.0–0.2)

## 2023-02-09 LAB — CMP (CANCER CENTER ONLY)
ALT: 17 U/L (ref 0–44)
AST: 19 U/L (ref 15–41)
Albumin: 4.1 g/dL (ref 3.5–5.0)
Alkaline Phosphatase: 73 U/L (ref 38–126)
Anion gap: 3 — ABNORMAL LOW (ref 5–15)
BUN: 13 mg/dL (ref 8–23)
CO2: 30 mmol/L (ref 22–32)
Calcium: 9.2 mg/dL (ref 8.9–10.3)
Chloride: 105 mmol/L (ref 98–111)
Creatinine: 0.94 mg/dL (ref 0.61–1.24)
GFR, Estimated: >60 mL/min (ref >60)
Glucose, Bld: 110 mg/dL — ABNORMAL HIGH (ref 70–99)
Potassium: 4.6 mmol/L (ref 3.5–5.1)
Sodium: 138 mmol/L (ref 135–145)
Total Bilirubin: 0.7 mg/dL (ref 0.0–1.2)
Total Protein: 6.9 g/dL (ref 6.5–8.1)

## 2023-02-09 LAB — CEA (ACCESS): CEA (CHCC): 2.99 ng/mL (ref 0.00–5.00)

## 2023-02-09 NOTE — Assessment & Plan Note (Addendum)
-  pT4N0M0, stage II   -diagnosed in 08/2020 --s/p right hemicolectomy on 01/31/21 with Dr. Cliffton Asters, pathology from colon and all 26 lymph nodes was negative. -he is on cancer surveillance. -restaging CT AP 06/28/21 showed NED.  -restaging CT CAP showed no lymphadenopathy or metastatic disease in chest abdomen or pelvis.  -patient is currently asymptomatic. Labs reviewed and are good. CEA is pending. Historically, they have been normal.  -new CT CAP ordered for surveillance.  -scheduled for follow up with Dr. Cliffton Asters, his surgeon, on 02/26/2023.  -will gradually move follow ups and scans out to 1 year intervals.

## 2023-02-09 NOTE — Progress Notes (Signed)
Patient Care Team: [name] as PCP - General (Family Medicine) Derenda Mis, PA-C (Inactive) (Dermatology)  Clinic Day:  02/09/2023  Referring physician: Richmond Campbell., PA-C  ASSESSMENT & PLAN:   Assessment & Plan: Primary appendiceal adenocarcinoma (HCC) -pT4N0M0, stage II   -diagnosed in 08/2020 --s/p right hemicolectomy on 01/31/21 with Dr. Cliffton Asters, pathology from colon and all 26 lymph nodes was negative. -he is on cancer surveillance. -restaging CT AP 06/28/21 showed NED.  -restaging CT CAP showed no lymphadenopathy or metastatic disease in chest abdomen or pelvis.  -patient is currently asymptomatic. Labs reviewed and are good. CEA is pending. Historically, they have been normal.  -new CT CAP ordered for surveillance.  -scheduled for follow up with Dr. Cliffton Asters, his surgeon, on 02/26/2023.  -will gradually move follow ups and scans out to 1 year intervals.     Plan: Labs reviewed  -CBC showing WBC 4.5; Hgb 14.7; Hct 43.1; Plt 156; Anc 3.7 -CMP - K 4.6; glucose 110; BUN 13; Creatinine 0.94; eGFR >60; Ca 9.2; LFTs normal.   -CEA - pending New CT CAP ordered today for surveillance.  Labs with follow up in 6 months  Follow up with Dr. Cliffton Asters, surgeon, as scheduled   The patient understands the plans discussed today and is in agreement with them.  He knows to contact our office if he develops concerns prior to his next appointment.  I provided 20 minutes of face-to-face time during this encounter and > 50% was spent counseling as documented under my assessment and plan.    Carlean Jews, NP  [facility] - A DEPT [facility] [address] [place] [address] Dept: [phone] Dept Fax: [phone]   Orders Placed This Encounter  Procedures   CT CHEST ABDOMEN PELVIS W CONTRAST    Standing Status:   Future    Expected Date:   03/12/2023    Expiration Date:   02/09/2024     If indicated for the ordered procedure, I authorize the administration of contrast media per Radiology protocol:   Yes    Does the patient have a contrast media/X-ray dye allergy?:   No    Preferred imaging location?:   [facility]    If indicated for the ordered procedure, I authorize the administration of oral contrast media per Radiology protocol:   Yes      CHIEF COMPLAINT:  CC: primary appendical adenocarcinoma  Current Treatment:  surveillance   INTERVAL HISTORY:  Chad Ford is here today for repeat clinical assessment. Last seen by Dr. Mosetta Putt 08/06/2022. He had surveillance CT done 08/05/2022 which showed no lymphadenopathy or evidence of metastatic disease in the chest abdomen or pelvis.  Most recent CEAs have been normal. He is due for new CT CAP for surveillance. He denies chest pain, chest pressure, or shortness of breath. He denies headaches or visual disturbances. He denies abdominal pain, nausea, vomiting, or changes in bowel or bladder habits.  He denies fevers or chills. He denies pain. His appetite is good. His weight has increased 9 pounds over last 6 months . He is scheduled to see Dr. Cliffton Asters, his surgeon, on 02/26/2023.   I have reviewed the past medical history, past surgical history, social history and family history with the patient and they are unchanged from previous note.  ALLERGIES:  is allergic to penicillins.  MEDICATIONS:  Current Outpatient Medications  Medication Sig Dispense Refill  amLODipine (NORVASC) 5 MG tablet Take 5 mg by mouth daily.     benazepril (LOTENSIN) 40 MG tablet Take 40 mg by mouth at bedtime.     ciclopirox (LOPROX) 0.77 % cream Apply 1 application topically See admin instructions. Apply to feet every other nite 90 g 2   doxazosin (CARDURA) 8 MG tablet Take 8 mg by mouth at bedtime.     naproxen sodium (ALEVE) 220 MG tablet Take 220 mg by mouth 2 (two) times daily as needed (pain/headache/fever).     pravastatin (PRAVACHOL) 40 MG tablet  Take 40 mg by mouth at bedtime.     No current facility-administered medications for this visit.    HISTORY OF PRESENT ILLNESS:   Oncology History Overview Note   Cancer Staging  Malignant neoplasm of prostate [facility]) Staging form: Prostate, AJCC 8th Edition - Clinical stage from 07/22/2019: Stage IIC (cT1c, cN0, cM0, PSA: 11.5, Grade Group: 3) - Signed by Marcello Fennel, PA-C on 08/26/2019 Histopathologic type: Adenocarcinoma, NOS Stage prefix: Initial diagnosis Prostate specific antigen (PSA) range: 10 to 19 Gleason primary pattern: 4 Gleason secondary pattern: 3 Gleason score: 7 Histologic grading system: 5 grade system Number of biopsy cores examined: 12 Number of biopsy cores positive: 2 Location of positive needle core biopsies: One side  Primary appendiceal adenocarcinoma (HCC) Staging form: Appendix, AJCC V9 - Pathologic stage from 09/04/2020: pT4, pN0, cM0, G2 - Unsigned Total positive nodes: 0 Histologic grading system: 3 grade system Residual tumor (R): R0 - None     Malignant neoplasm of prostate (HCC)  07/22/2019 Cancer Staging   Staging form: Prostate, AJCC 8th Edition - Clinical stage from 07/22/2019: Stage IIC (cT1c, cN0, cM0, PSA: 11.5, Grade Group: 3) - Signed by Marcello Fennel, PA-C on 08/26/2019   08/26/2019 Initial Diagnosis   Malignant neoplasm of prostate (HCC)   06/28/2021 Imaging    IMPRESSION: 1. No evidence of recurrent or metastatic disease in the abdomen or pelvis. 2. Stable small cystic pancreatic lesions better evaluated on prior MRI December 21, 2020. Per that studies recommendations recommend follow-up MRI/MRCP with and without contrast in 2 years from that examination. 3. Nonobstructive 6 mm left lower pole renal stone. 4. Moderate volume of formed stool throughout the colon suggestive of constipation.   12/31/2021 Imaging    IMPRESSION: 1. No evidence of local recurrence or metastatic disease within the chest, abdomen, or pelvis. 2.  Large volume of formed stool in the colon suggestive of constipation. 3. 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 4. Mild symmetric esophageal wall thickening, correlate for symptoms of esophagitis. 5. Nonobstructive 6 mm left lower pole renal stone. 6.  Aortic Atherosclerosis (ICD10-I70.0).   Primary appendiceal adenocarcinoma (HCC)  09/03/2020 Imaging   EXAM: CT ABDOMEN AND PELVIS WITH CONTRAST  IMPRESSION: 1. Acute appendicitis. No abscess or perforation. 2. Sigmoid diverticulosis. 3. Indeterminate 5 mm low attenuating/cystic lesion involving the posterior aspect of the uncinate process of the pancreas. This can be better characterized with MRI on a nonemergent/outpatient basis. 4. Aortic Atherosclerosis (ICD10-I70.0).   09/04/2020 Pathology Results   FINAL MICROSCOPIC DIAGNOSIS:   A. APPENDIX, APPENDECTOMY:  -  Adenocarcinoma with neuroendocrine features, moderately  differentiated  -  Proximal margin uninvolved by adenocarcinoma  -  See oncology table below    10/02/2020 Imaging   EXAM: CT CHEST WITH CONTRAST  IMPRESSION: Small sliding-type hiatal hernia.  No other significant abnormality seen in the chest.   12/21/2020 Imaging   EXAM: MRI ABDOMEN WITHOUT AND WITH CONTRAST  IMPRESSION: 1. 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 The largest of these measures up to 1.3 x 0.8 cm in the posterior aspect of the pancreatic head. Repeat abdominal MRI with and without IV gadolinium with MRCP is recommended in 2 years to ensure stability of these findings. This recommendation follows ACR consensus guidelines: Management of Incidental Pancreatic Cysts: A White Paper of the ACR Incidental Findings Committee. J Am Coll Radiol  2017;14:911-923. 2. Mild left-sided hydronephrosis, suggestive of potential left UPJ obstruction. Further clinical evaluation is recommended. 3. Aortic atherosclerosis. 4. Additional incidental findings, as above.   01/31/2021 Pathology Results   FINAL MICROSCOPIC DIAGNOSIS:   A. COLON, RIGHT, HEMI-COLECTOMY:  -  Benign colon  -  No carcinoma identified in twenty-six lymph nodes(0/26)    02/27/2021 Initial Diagnosis   Primary appendiceal adenocarcinoma (HCC)   12/31/2021 Imaging    IMPRESSION: 1. No evidence of local recurrence or metastatic disease within the chest, abdomen, or pelvis. 2. Large volume of formed stool in the colon suggestive of constipation. 3. 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 4. Mild symmetric esophageal wall thickening, correlate for symptoms of esophagitis. 5. Nonobstructive 6 mm left lower pole renal stone. 6.  Aortic Atherosclerosis (ICD10-I70       REVIEW OF SYSTEMS:   Constitutional: Denies fevers, chills or abnormal weight loss Eyes: Denies blurriness of vision Ears, nose, mouth, throat, and face: Denies mucositis or sore throat Respiratory: Denies cough, dyspnea or wheezes Cardiovascular: Denies palpitation, chest discomfort or lower extremity swelling Gastrointestinal:  Denies nausea, heartburn or change in bowel habits Skin: Denies abnormal skin rashes Lymphatics: Denies new lymphadenopathy or easy bruising Neurological:Denies numbness, tingling or new weaknesses Behavioral/Psych: Mood is stable, no new changes  All other systems were reviewed with the patient and are negative.   VITALS:   Today's Vitals   02/09/23 1345  BP: 128/67  Pulse: 63  Resp: 18  Temp: 98.4 F (36.9 C)  TempSrc: Temporal  SpO2: 98%  Weight: 164 lb 1.6 oz (74.4 kg)  PainSc: 0-No pain   Body mass index is  24.59 kg/m.   Wt Readings from Last 3 Encounters:  02/09/23 164 lb 1.6 oz (74.4 kg)  08/06/22 155 lb 3.2 oz (70.4 kg)  05/05/22 163 lb 6.4 oz (74.1 kg)    Body mass index is 24.59 kg/m.  Performance status (ECOG): 0 - Asymptomatic  PHYSICAL EXAM:   GENERAL:alert, no distress and comfortable SKIN: skin color, texture, turgor are normal, no rashes or significant lesions EYES: normal, Conjunctiva are pink and non-injected, sclera clear OROPHARYNX:no exudate, no erythema and lips, buccal mucosa, and tongue normal  NECK: supple, thyroid normal size, non-tender, without nodularity LYMPH:  no palpable lymphadenopathy in the cervical, axillary or inguinal LUNGS: clear to auscultation and percussion with normal breathing effort HEART: regular rate & rhythm and no murmurs and no lower extremity edema ABDOMEN:abdomen soft, non-tender and normal bowel sounds Musculoskeletal:no cyanosis of digits and no clubbing  NEURO: alert & oriented x 3 with fluent speech, no focal motor/sensory deficits  LABORATORY DATA:  I have reviewed the data as listed    Component Value Date/Time   NA 138  02/09/2023 1304   K 4.6 02/09/2023 1304   CL 105 02/09/2023 1304   CO2 30 02/09/2023 1304   GLUCOSE 110 (H) 02/09/2023 1304   BUN 13 02/09/2023 1304   CREATININE 0.94 02/09/2023 1304   CALCIUM 9.2 02/09/2023 1304   PROT 6.9 02/09/2023 1304   ALBUMIN 4.1 02/09/2023 1304   AST 19 02/09/2023 1304   ALT 17 02/09/2023 1304   ALKPHOS 73 02/09/2023 1304   BILITOT 0.7 02/09/2023 1304   GFRNONAA >60 02/09/2023 1304   GFRAA >60 12/24/2015 1127    Lab Results  Component Value Date   WBC 4.5 02/09/2023   NEUTROABS 3.7 02/09/2023   HGB 14.7 02/09/2023   HCT 43.1 02/09/2023   MCV 91.9 02/09/2023   PLT 156 02/09/2023

## 2023-02-10 ENCOUNTER — Telehealth: Payer: Self-pay | Admitting: Nurse Practitioner

## 2023-02-10 NOTE — Telephone Encounter (Signed)
Patient is aware of scheduled appointment times/dates

## 2023-03-12 ENCOUNTER — Ambulatory Visit (HOSPITAL_COMMUNITY)
Admission: RE | Admit: 2023-03-12 | Discharge: 2023-03-12 | Disposition: A | Discharge status: Home / Self Care | Payer: PPO | Source: Ambulatory Visit | Attending: Nurse Practitioner | Admitting: Nurse Practitioner

## 2023-03-12 DIAGNOSIS — C181 Malignant neoplasm of appendix: Secondary | ICD-10-CM | POA: Insufficient documentation

## 2023-03-12 MED ORDER — IOHEXOL 300 MG/ML  SOLN
30.0000 mL | Freq: Once | INTRAMUSCULAR | Status: AC | PRN
  Administered 2023-03-12: 30 mL via ORAL

## 2023-03-12 MED ORDER — IOHEXOL 300 MG/ML  SOLN
100.0000 mL | Freq: Once | INTRAMUSCULAR | Status: AC | PRN
  Administered 2023-03-12: 100 mL via INTRAVENOUS

## 2023-08-12 ENCOUNTER — Other Ambulatory Visit: Payer: Self-pay | Admitting: Nurse Practitioner

## 2023-08-12 DIAGNOSIS — C181 Malignant neoplasm of appendix: Secondary | ICD-10-CM

## 2023-08-12 NOTE — Progress Notes (Signed)
 Patient Care Team: Debrah Josette MICAEL DEVONNA as PCP - General (Family Medicine) Connee Nest, PA-C (Inactive) (Dermatology)  Clinic Day:  08/13/2023  Referring physician: Debrah Josette ORN., PA-C  ASSESSMENT & PLAN:   Assessment & Plan: Primary appendiceal adenocarcinoma (HCC) -pT4N0M0, stage II   -diagnosed in 08/2020 --s/p right hemicolectomy on 01/31/21 with Dr. Teresa, pathology from colon and all 26 lymph nodes was negative. -he is on cancer surveillance. -restaging CT AP 06/28/21 showed NED.  -restaging CT CAP showed no lymphadenopathy or metastatic disease in chest abdomen or pelvis.  -patient is currently asymptomatic.  - CT CAP from 02/2023 showing a 1 cm -scheduled for follow up with Dr. Teresa, his surgeon, on 02/26/2023.  - CT CAP from 02/2023 showing a 1 cm hypodensity in the pancreatic head with minimal change in low-density focus at anastomosis with minimal change.  Will repeat CT CAP in September 2025 for surveillance. --CEA within normal limits and lower than previous at 2.48. -Plan to follow-up with patient approximately 1 to 2 weeks after her CT CAP to review.   Primary appendiceal adenocarcinoma Reviewed most recent CT CAP from 02/2023.  There were 0 interval changes.  There is a 1 cm hypodensity in the pancreatic head which showed minimal change.  There is also a low-density focus at the anastomosis which also showed minimal change.  Will repeat CT CAP in September or October 2025 to monitor changes of these 2 small densities.  Will see him back 1 to 2 weeks after CT scan.  Plan Labs reviewed. - CBC showing mild normocytic pia with platelet count 146.  CBC otherwise unremarkable. - CMP is unremarkable. - CEA is normal at 2.48. Repeat CT CAP in September or October 2025 to evaluate small nodular densities in the pancreatic head and anastomosis. Labs and follow-up 1 to 2 weeks after CT CAP.  The patient understands the plans discussed today and is in  agreement with them.  He knows to contact our office if he develops concerns prior to his next appointment.  I provided 25 minutes of face-to-face time during this encounter and > 50% was spent counseling as documented under my assessment and plan.    Powell FORBES Lessen, NP  [facility] - A DEPT OF [facility] [address] [place] [address] Dept: [phone] Dept Fax: [phone]   No orders of the defined types were placed in this encounter.     CHIEF COMPLAINT:  CC: Primary appendiceal adenocarcinoma  Current Treatment: Surveillance  INTERVAL HISTORY:  Tallan is here today for repeat clinical assessment.  He was last seen by me on 02/09/2023.  Patient reports feeling well in general.  She does have intermittent diarrhea which is his baseline since surgery.  He will take Pepto-Bismol when needed which is effective.  He denies new symptoms or concerns.  He denies chest pain, chest pressure, or shortness of breath. He denies headaches or visual disturbances. He denies abdominal pain, nausea, vomiting, or changes in bowel or bladder habits.  He denies fevers or chills. He denies pain. His appetite is good. His weight has decreased 5 pounds over last 6 months.  He continues to be followed by [facility] for history of prostate cancer.  I have reviewed the past medical history, past surgical history, social history and family history with the patient and they are unchanged from previous note.  ALLERGIES:  is allergic to penicillins.  MEDICATIONS:  Current Outpatient Medications  Medication Sig Dispense Refill   amLODipine  (NORVASC ) 5 MG tablet Take 5 mg by mouth daily.     benazepril  (LOTENSIN ) 40 MG tablet Take 40 mg by mouth at bedtime.     ciclopirox  (LOPROX ) 0.77 % cream Apply 1 application topically See admin instructions. Apply to feet every other nite 90 g 2   doxazosin  (CARDURA ) 8 MG tablet Take 8 mg by  mouth at bedtime.     naproxen sodium (ALEVE) 220 MG tablet Take 220 mg by mouth 2 (two) times daily as needed (pain/headache/fever).     pravastatin  (PRAVACHOL ) 40 MG tablet Take 40 mg by mouth at bedtime.     No current facility-administered medications for this visit.    HISTORY OF PRESENT ILLNESS:   Oncology History Overview Note   Cancer Staging  Malignant neoplasm of prostate [facility]) Staging form: Prostate, AJCC 8th Edition - Clinical stage from 07/22/2019: Stage IIC (cT1c, cN0, cM0, PSA: 11.5, Grade Group: 3) - Signed by Sherwood Rise, PA-C on 08/26/2019 Histopathologic type: Adenocarcinoma, NOS Stage prefix: Initial diagnosis Prostate specific antigen (PSA) range: 10 to 19 Gleason primary pattern: 4 Gleason secondary pattern: 3 Gleason score: 7 Histologic grading system: 5 grade system Number of biopsy cores examined: 12 Number of biopsy cores positive: 2 Location of positive needle core biopsies: One side  Primary appendiceal adenocarcinoma (HCC) Staging form: Appendix, AJCC V9 - Pathologic stage from 09/04/2020: pT4, pN0, cM0, G2 - Unsigned Total positive nodes: 0 Histologic grading system: 3 grade system Residual tumor (R): R0 - None     Malignant neoplasm of prostate (HCC)  07/22/2019 Cancer Staging   Staging form: Prostate, AJCC 8th Edition - Clinical stage from 07/22/2019: Stage IIC (cT1c, cN0, cM0, PSA: 11.5, Grade Group: 3) - Signed by Sherwood Rise, PA-C on 08/26/2019   08/26/2019 Initial Diagnosis   Malignant neoplasm of prostate (HCC)   06/28/2021 Imaging    IMPRESSION: 1. No evidence of recurrent or metastatic disease in the abdomen or pelvis. 2. Stable small cystic pancreatic lesions better evaluated on prior MRI December 21, 2020. Per that studies recommendations recommend follow-up MRI/MRCP with and without contrast in 2 years from that examination. 3. Nonobstructive 6 mm left lower pole renal stone. 4. Moderate volume of formed stool throughout the  colon suggestive of constipation.   12/31/2021 Imaging    IMPRESSION: 1. No evidence of local recurrence or metastatic disease within the chest, abdomen, or pelvis. 2. Large volume of formed stool in the colon suggestive of constipation. 3. 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 4. Mild symmetric esophageal wall thickening, correlate for symptoms of esophagitis. 5. Nonobstructive 6 mm left lower pole renal stone. 6.  Aortic Atherosclerosis (ICD10-I70.0).   Primary appendiceal adenocarcinoma (HCC)  09/03/2020 Imaging   EXAM: CT ABDOMEN AND PELVIS WITH CONTRAST  IMPRESSION: 1. Acute appendicitis. No abscess or perforation. 2. Sigmoid diverticulosis. 3. Indeterminate 5 mm low attenuating/cystic lesion involving the posterior aspect of the uncinate process of the pancreas. This can be better characterized with MRI on a nonemergent/outpatient basis. 4. Aortic Atherosclerosis (ICD10-I70.0).   09/04/2020 Pathology Results   FINAL MICROSCOPIC DIAGNOSIS:   A. APPENDIX, APPENDECTOMY:  -  Adenocarcinoma with neuroendocrine features, moderately  differentiated  -  Proximal margin uninvolved by adenocarcinoma  -  See oncology table below    10/02/2020 Imaging   EXAM:  CT CHEST WITH CONTRAST  IMPRESSION: Small sliding-type hiatal hernia.   No other significant abnormality seen in the chest.   12/21/2020 Imaging   EXAM: MRI ABDOMEN WITHOUT AND WITH CONTRAST  IMPRESSION: 1. 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 The largest of these measures up to 1.3 x 0.8 cm in the posterior aspect of the pancreatic head. Repeat abdominal MRI with and without IV gadolinium with MRCP is recommended in 2 years to ensure stability of these findings.  This recommendation follows ACR consensus guidelines: Management of Incidental Pancreatic Cysts: A White Paper of the ACR Incidental Findings Committee. J Am Coll Radiol 2017;14:911-923. 2. Mild left-sided hydronephrosis, suggestive of potential left UPJ obstruction. Further clinical evaluation is recommended. 3. Aortic atherosclerosis. 4. Additional incidental findings, as above.   01/31/2021 Pathology Results   FINAL MICROSCOPIC DIAGNOSIS:   A. COLON, RIGHT, HEMI-COLECTOMY:  -  Benign colon  -  No carcinoma identified in twenty-six lymph nodes(0/26)    02/27/2021 Initial Diagnosis   Primary appendiceal adenocarcinoma (HCC)   12/31/2021 Imaging    IMPRESSION: 1. No evidence of local recurrence or metastatic disease within the chest, abdomen, or pelvis. 2. Large volume of formed stool in the colon suggestive of constipation. 3. 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 4. Mild symmetric esophageal wall thickening, correlate for symptoms of esophagitis. 5. Nonobstructive 6 mm left lower pole renal stone. 6.  Aortic Atherosclerosis (ICD10-I70       REVIEW OF SYSTEMS:   Constitutional: Denies fevers or chills. Has had a 5 pound weight loss since last visit.  Eyes: Denies blurriness of vision Ears, nose, mouth, throat, and face: Denies mucositis or sore throat Respiratory: Denies cough, dyspnea or wheezes Cardiovascular: Denies palpitation, chest discomfort or lower extremity swelling Gastrointestinal:  Denies nausea, heartburn or change in bowel habits Skin: Denies abnormal skin rashes Lymphatics: Denies new lymphadenopathy or easy bruising Neurological:Denies numbness, tingling or new weaknesses Behavioral/Psych: Mood is stable, no new changes  All other systems were reviewed with the patient and are negative.   VITALS:    Today's Vitals   08/13/23 1347  BP: 108/68  Pulse: (!) 51  Resp: 17  Temp: 98.6 F (37 C)  SpO2: 97%  Weight: 159 lb 14.4 oz (72.5 kg)   Body mass index is 23.96 kg/m.   Wt Readings from Last 3 Encounters:  08/13/23 159 lb 14.4 oz (72.5 kg)  02/09/23 164 lb 1.6 oz (74.4 kg)  08/06/22 155 lb 3.2 oz (70.4 kg)    Body mass index is 23.96 kg/m.  Performance status (ECOG): 1 - Symptomatic but completely ambulatory  PHYSICAL EXAM:   GENERAL:alert, no distress and comfortable SKIN: skin color, texture, turgor are normal, no rashes or significant lesions EYES: normal, Conjunctiva are pink and non-injected, sclera clear OROPHARYNX:no exudate, no erythema and lips, buccal mucosa, and tongue normal  NECK: supple, thyroid normal size, non-tender, without nodularity LYMPH:  no palpable lymphadenopathy in the cervical, axillary or inguinal LUNGS: clear to auscultation and percussion with normal breathing effort HEART: regular rate & rhythm and no murmurs and no lower extremity edema ABDOMEN:abdomen soft, non-tender and normal bowel sounds Musculoskeletal:no cyanosis of digits and no clubbing  NEURO: alert & oriented x 3 with fluent speech, no focal motor/sensory deficits  LABORATORY DATA:  I have reviewed the data as listed    Component Value Date/Time   NA 139 08/13/2023 1327   K 4.1 08/13/2023 1327   CL 107 08/13/2023 1327   CO2 28 08/13/2023 1327   GLUCOSE 105 (H) 08/13/2023 1327   BUN 17 08/13/2023 1327   CREATININE 1.02 08/13/2023 1327   CALCIUM 9.0 08/13/2023 1327   PROT 6.9 08/13/2023 1327   ALBUMIN 3.9 08/13/2023 1327   AST 19 08/13/2023 1327   ALT 17 08/13/2023 1327   ALKPHOS 71 08/13/2023 1327   BILITOT 0.8 08/13/2023 1327   GFRNONAA >60 08/13/2023 1327   GFRAA >60 12/24/2015 1127    Lab Results  Component Value Date   WBC 4.2 08/13/2023   NEUTROABS 3.1 08/13/2023   HGB 14.2 08/13/2023   HCT 41.2 08/13/2023   MCV 92.6 08/13/2023   PLT 146 (L)  08/13/2023

## 2023-08-12 NOTE — Assessment & Plan Note (Addendum)
-  pT4N0M0, stage II   -diagnosed in 08/2020 --s/p right hemicolectomy on 01/31/21 with Dr. Teresa, pathology from colon and all 26 lymph nodes was negative. -he is on cancer surveillance. -restaging CT AP 06/28/21 showed NED.  -restaging CT CAP showed no lymphadenopathy or metastatic disease in chest abdomen or pelvis.  -patient is currently asymptomatic.  - CT CAP from 02/2023 showing a 1 cm -scheduled for follow up with Dr. Teresa, his surgeon, on 02/26/2023.  - CT CAP from 02/2023 showing a 1 cm hypodensity in the pancreatic head with minimal change in low-density focus at anastomosis with minimal change.  Will repeat CT CAP in September 2025 for surveillance. --CEA within normal limits and lower than previous at 2.48. -Plan to follow-up with patient approximately 1 to 2 weeks after her CT CAP to review.

## 2023-08-13 ENCOUNTER — Inpatient Hospital Stay: Payer: PPO | Attending: Nurse Practitioner

## 2023-08-13 ENCOUNTER — Inpatient Hospital Stay: Payer: PPO | Admitting: Nurse Practitioner

## 2023-08-13 ENCOUNTER — Other Ambulatory Visit: Payer: Self-pay

## 2023-08-13 VITALS — BP 108/68 | HR 51 | Temp 98.6°F | Resp 17 | Wt 159.9 lb

## 2023-08-13 DIAGNOSIS — C181 Malignant neoplasm of appendix: Secondary | ICD-10-CM

## 2023-08-13 DIAGNOSIS — Z85038 Personal history of other malignant neoplasm of large intestine: Secondary | ICD-10-CM | POA: Diagnosis present

## 2023-08-13 DIAGNOSIS — Z8546 Personal history of malignant neoplasm of prostate: Secondary | ICD-10-CM | POA: Diagnosis present

## 2023-08-13 LAB — CMP (CANCER CENTER ONLY)
ALT: 17 U/L (ref 0–44)
AST: 19 U/L (ref 15–41)
Albumin: 3.9 g/dL (ref 3.5–5.0)
Alkaline Phosphatase: 71 U/L (ref 38–126)
Anion gap: 4 — ABNORMAL LOW (ref 5–15)
BUN: 17 mg/dL (ref 8–23)
CO2: 28 mmol/L (ref 22–32)
Calcium: 9 mg/dL (ref 8.9–10.3)
Chloride: 107 mmol/L (ref 98–111)
Creatinine: 1.02 mg/dL (ref 0.61–1.24)
GFR, Estimated: >60 mL/min (ref >60)
Glucose, Bld: 105 mg/dL — ABNORMAL HIGH (ref 70–99)
Potassium: 4.1 mmol/L (ref 3.5–5.1)
Sodium: 139 mmol/L (ref 135–145)
Total Bilirubin: 0.8 mg/dL (ref 0.0–1.2)
Total Protein: 6.9 g/dL (ref 6.5–8.1)

## 2023-08-13 LAB — CBC WITH DIFFERENTIAL (CANCER CENTER ONLY)
Abs Immature Granulocytes: 0.03 K/uL (ref 0.00–0.07)
Basophils Absolute: 0 K/uL (ref 0.0–0.1)
Basophils Relative: 1 %
Eosinophils Absolute: 0 K/uL (ref 0.0–0.5)
Eosinophils Relative: 1 %
HCT: 41.2 % (ref 39.0–52.0)
Hemoglobin: 14.2 g/dL (ref 13.0–17.0)
Immature Granulocytes: 1 %
Lymphocytes Relative: 13 %
Lymphs Abs: 0.5 K/uL — ABNORMAL LOW (ref 0.7–4.0)
MCH: 31.9 pg (ref 26.0–34.0)
MCHC: 34.5 g/dL (ref 30.0–36.0)
MCV: 92.6 fL (ref 80.0–100.0)
Monocytes Absolute: 0.4 K/uL (ref 0.1–1.0)
Monocytes Relative: 10 %
Neutro Abs: 3.1 K/uL (ref 1.7–7.7)
Neutrophils Relative %: 74 %
Platelet Count: 146 K/uL — ABNORMAL LOW (ref 150–400)
RBC: 4.45 MIL/uL (ref 4.22–5.81)
RDW: 12.6 % (ref 11.5–15.5)
WBC Count: 4.2 K/uL (ref 4.0–10.5)
nRBC: 0 % (ref 0.0–0.2)

## 2023-08-13 LAB — CEA (ACCESS): CEA (CHCC): 2.48 ng/mL (ref 0.00–5.00)

## 2023-08-14 ENCOUNTER — Telehealth: Payer: Self-pay | Admitting: Nurse Practitioner

## 2023-08-14 NOTE — Telephone Encounter (Signed)
Scheduled appointments with the patient

## 2023-08-16 ENCOUNTER — Encounter: Payer: Self-pay | Admitting: Nurse Practitioner

## 2023-10-21 ENCOUNTER — Ambulatory Visit
Admission: RE | Admit: 2023-10-21 | Discharge: 2023-10-21 | Disposition: A | Source: Ambulatory Visit | Attending: Nurse Practitioner

## 2023-10-21 DIAGNOSIS — C181 Malignant neoplasm of appendix: Secondary | ICD-10-CM

## 2023-10-21 MED ORDER — IOPAMIDOL (ISOVUE-300) INJECTION 61%
100.0000 mL | Freq: Once | INTRAVENOUS | Status: AC | PRN
  Administered 2023-10-21: 100 mL via INTRAVENOUS

## 2023-11-19 ENCOUNTER — Encounter: Payer: Self-pay | Admitting: Nurse Practitioner

## 2023-11-19 ENCOUNTER — Inpatient Hospital Stay: Attending: Nurse Practitioner | Admitting: Nurse Practitioner

## 2023-11-19 ENCOUNTER — Inpatient Hospital Stay

## 2023-11-19 VITALS — BP 130/88 | HR 54 | Temp 97.8°F | Resp 17 | Wt 164.9 lb

## 2023-11-19 DIAGNOSIS — Z8546 Personal history of malignant neoplasm of prostate: Secondary | ICD-10-CM | POA: Insufficient documentation

## 2023-11-19 DIAGNOSIS — C61 Malignant neoplasm of prostate: Secondary | ICD-10-CM | POA: Insufficient documentation

## 2023-11-19 DIAGNOSIS — C181 Malignant neoplasm of appendix: Secondary | ICD-10-CM

## 2023-11-19 DIAGNOSIS — Z85038 Personal history of other malignant neoplasm of large intestine: Secondary | ICD-10-CM | POA: Diagnosis present

## 2023-11-19 LAB — CBC WITH DIFFERENTIAL (CANCER CENTER ONLY)
Abs Immature Granulocytes: 0.02 K/uL (ref 0.00–0.07)
Basophils Absolute: 0 K/uL (ref 0.0–0.1)
Basophils Relative: 1 %
Eosinophils Absolute: 0.1 K/uL (ref 0.0–0.5)
Eosinophils Relative: 2 %
HCT: 43 % (ref 39.0–52.0)
Hemoglobin: 14.2 g/dL (ref 13.0–17.0)
Immature Granulocytes: 0 %
Lymphocytes Relative: 12 %
Lymphs Abs: 0.6 K/uL — ABNORMAL LOW (ref 0.7–4.0)
MCH: 31.2 pg (ref 26.0–34.0)
MCHC: 33 g/dL (ref 30.0–36.0)
MCV: 94.5 fL (ref 80.0–100.0)
Monocytes Absolute: 0.5 K/uL (ref 0.1–1.0)
Monocytes Relative: 11 %
Neutro Abs: 3.3 K/uL (ref 1.7–7.7)
Neutrophils Relative %: 74 %
Platelet Count: 144 K/uL — ABNORMAL LOW (ref 150–400)
RBC: 4.55 MIL/uL (ref 4.22–5.81)
RDW: 12.7 % (ref 11.5–15.5)
WBC Count: 4.5 K/uL (ref 4.0–10.5)
nRBC: 0 % (ref 0.0–0.2)

## 2023-11-19 LAB — CMP (CANCER CENTER ONLY)
ALT: 15 U/L (ref 0–44)
AST: 19 U/L (ref 15–41)
Albumin: 4 g/dL (ref 3.5–5.0)
Alkaline Phosphatase: 71 U/L (ref 38–126)
Anion gap: 5 (ref 5–15)
BUN: 13 mg/dL (ref 8–23)
CO2: 29 mmol/L (ref 22–32)
Calcium: 9.2 mg/dL (ref 8.9–10.3)
Chloride: 105 mmol/L (ref 98–111)
Creatinine: 1.02 mg/dL (ref 0.61–1.24)
GFR, Estimated: >60 mL/min (ref >60)
Glucose, Bld: 103 mg/dL — ABNORMAL HIGH (ref 70–99)
Potassium: 5 mmol/L (ref 3.5–5.1)
Sodium: 139 mmol/L (ref 135–145)
Total Bilirubin: 0.7 mg/dL (ref 0.0–1.2)
Total Protein: 6.9 g/dL (ref 6.5–8.1)

## 2023-11-19 LAB — CEA (ACCESS): CEA (CHCC): 2.52 ng/mL (ref 0.00–5.00)

## 2023-11-19 NOTE — Progress Notes (Signed)
 [facility]   Telephone:(336) [phone] Fax:(336) [phone]    Patient Care Team: Debrah Josette MICAEL DEVONNA as PCP - General (Family Medicine) Connee, Delon, PA-C (Inactive) (Dermatology)   CHIEF COMPLAINT: Follow-up appendiceal cancer  Oncology History Overview Note   Cancer Staging  Malignant neoplasm of prostate [facility]) Staging form: Prostate, AJCC 8th Edition - Clinical stage from 07/22/2019: Stage IIC (cT1c, cN0, cM0, PSA: 11.5, Grade Group: 3) - Signed by Sherwood Rise, PA-C on 08/26/2019 Histopathologic type: Adenocarcinoma, NOS Stage prefix: Initial diagnosis Prostate specific antigen (PSA) range: 10 to 19 Gleason primary pattern: 4 Gleason secondary pattern: 3 Gleason score: 7 Histologic grading system: 5 grade system Number of biopsy cores examined: 12 Number of biopsy cores positive: 2 Location of positive needle core biopsies: One side  Primary appendiceal adenocarcinoma (HCC) Staging form: Appendix, AJCC V9 - Pathologic stage from 09/04/2020: pT4, pN0, cM0, G2 - Unsigned Total positive nodes: 0 Histologic grading system: 3 grade system Residual tumor (R): R0 - None     Malignant neoplasm of prostate (HCC)  07/22/2019 Cancer Staging   Staging form: Prostate, AJCC 8th Edition - Clinical stage from 07/22/2019: Stage IIC (cT1c, cN0, cM0, PSA: 11.5, Grade Group: 3) - Signed by Sherwood Rise, PA-C on 08/26/2019   08/26/2019 Initial Diagnosis   Malignant neoplasm of prostate (HCC)   06/28/2021 Imaging    IMPRESSION: 1. No evidence of recurrent or metastatic disease in the abdomen or pelvis. 2. Stable small cystic pancreatic lesions better evaluated on prior MRI December 21, 2020. Per that studies recommendations recommend follow-up MRI/MRCP with and without contrast in 2 years from that examination. 3. Nonobstructive 6 mm left lower pole renal stone. 4. Moderate volume of formed stool throughout the colon suggestive of constipation.    12/31/2021 Imaging    IMPRESSION: 1. No evidence of local recurrence or metastatic disease within the chest, abdomen, or pelvis. 2. Large volume of formed stool in the colon suggestive of constipation. 3. 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 4. Mild symmetric esophageal wall thickening, correlate for symptoms of esophagitis. 5. Nonobstructive 6 mm left lower pole renal stone. 6.  Aortic Atherosclerosis (ICD10-I70.0).   Primary appendiceal adenocarcinoma (HCC)  09/03/2020 Imaging   EXAM: CT ABDOMEN AND PELVIS WITH CONTRAST  IMPRESSION: 1. Acute appendicitis. No abscess or perforation. 2. Sigmoid diverticulosis. 3. Indeterminate 5 mm low attenuating/cystic lesion involving the posterior aspect of the uncinate process of the pancreas. This can be better characterized with MRI on a nonemergent/outpatient basis. 4. Aortic Atherosclerosis (ICD10-I70.0).   09/04/2020 Pathology Results   FINAL MICROSCOPIC DIAGNOSIS:   A. APPENDIX, APPENDECTOMY:  -  Adenocarcinoma with neuroendocrine features, moderately  differentiated  -  Proximal margin uninvolved by adenocarcinoma  -  See oncology table below    10/02/2020 Imaging   EXAM: CT CHEST WITH CONTRAST  IMPRESSION: Small sliding-type hiatal hernia.   No other significant abnormality seen in the chest.   12/21/2020 Imaging   EXAM: MRI ABDOMEN WITHOUT AND WITH CONTRAST  IMPRESSION: 1. 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 The largest of these measures up to 1.3 x 0.8 cm in the posterior aspect of the pancreatic head. Repeat abdominal MRI with and without IV gadolinium with MRCP is recommended in 2 years to ensure  stability of these findings. This recommendation follows ACR  consensus guidelines: Management of Incidental Pancreatic Cysts: A White Paper of the ACR Incidental Findings Committee. J Am Coll Radiol 2017;14:911-923. 2. Mild left-sided hydronephrosis, suggestive of potential left UPJ obstruction. Further clinical evaluation is recommended. 3. Aortic atherosclerosis. 4. Additional incidental findings, as above.   01/31/2021 Pathology Results   FINAL MICROSCOPIC DIAGNOSIS:   A. COLON, RIGHT, HEMI-COLECTOMY:  -  Benign colon  -  No carcinoma identified in twenty-six lymph nodes(0/26)    02/27/2021 Initial Diagnosis   Primary appendiceal adenocarcinoma (HCC)   12/31/2021 Imaging    IMPRESSION: 1. No evidence of local recurrence or metastatic disease within the chest, abdomen, or pelvis. 2. Large volume of formed stool in the colon suggestive of constipation. 3. 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 4. Mild symmetric esophageal wall thickening, correlate for symptoms of esophagitis. 5. Nonobstructive 6 mm left lower pole renal stone. 6.  Aortic Atherosclerosis (ICD10-I70      CURRENT THERAPY: Surveillance  INTERVAL HISTORY Chad Ford returns for follow-up as scheduled, last seen by Powell Lessen NP 08/13/2023.  Trying to get in good physical shape.  Energy is decent, normal appetite.  Denies abdominal pain/bloating, nausea/vomiting, change in bowel habits, or any other new or specific complaints.  He had a recent CT scan.  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   Left Upper Forehead (excision)   Prostate cancer (HCC)    Psoriasis    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   Right Forearm (Keratoacanthoma) (curet and 5FU)   Superficial basal cell carcinoma (BCC) 06/16/2019   Left Upper Arm - Anterior   Vertigo    treats with specific exercises     Past Surgical History:  Procedure Laterality Date   APPENDECTOMY  09/04/2020   CIRCUMCISION     as a child   COLON SURGERY  01/31/2021   COLONOSCOPY     COLONOSCOPY W/ POLYPECTOMY     HEMORRHOID SURGERY  2000   HERNIA REPAIR Right 2002   Inguinal   INGUINAL HERNIA REPAIR Left 12/31/2015   Procedure: HERNIA REPAIR INGUINAL ADULT;  Surgeon: Dann Hummer, MD;  Location: [facility] OR;  Service: General;  Laterality: Left;   INSERTION OF MESH Left 12/31/2015   Procedure: INSERTION OF MESH;  Surgeon: Dann Hummer, MD;  Location: [facility] OR;  Service: General;  Laterality: Left;   LAPAROSCOPIC APPENDECTOMY N/A 09/04/2020   Procedure: APPENDECTOMY LAPAROSCOPIC;  Surgeon: Vanderbilt Ned, MD;  Location: [facility] OR;  Service: General;  Laterality: N/A;   LAPAROSCOPIC RIGHT HEMI COLECTOMY Right 01/31/2021   Procedure: LAPAROSCOPIC RIGHT HEMI COLECTOMY WITH TAPPS BLOCK;  Surgeon: Teresa Lonni HERO, MD;  Location: [facility] ORS;  Service: General;  Laterality: Right;   RADIOACTIVE SEED IMPLANT N/A 10/28/2019   Procedure: RADIOACTIVE SEED IMPLANT/BRACHYTHERAPY IMPLANT;  Surgeon: Selma Donnice SAUNDERS, MD;  Location: [facility];  Service: Urology;  Laterality: N/A;   SPACE OAR INSTILLATION N/A 10/28/2019   Procedure: SPACE OAR INSTILLATION;  Surgeon: Selma Donnice SAUNDERS, MD;  Location: [facility];  Service: Urology;  Laterality: N/A;   TONSILLECTOMY     as a child     Outpatient Encounter  Medications as of 11/19/2023  Medication Sig   amLODipine  (NORVASC ) 5 MG tablet Take 5 mg by mouth daily.   benazepril  (LOTENSIN ) 40 MG tablet Take 40 mg by mouth at bedtime.   ciclopirox  (LOPROX ) 0.77 % cream Apply 1 application topically See admin instructions. Apply to feet every other nite   doxazosin  (CARDURA ) 8 MG tablet Take 8 mg by mouth at bedtime.   naproxen sodium (ALEVE) 220 MG tablet Take 220 mg by mouth 2 (two) times daily as needed (pain/headache/fever).   pravastatin  (PRAVACHOL ) 40 MG tablet Take 40 mg by mouth at bedtime.   sildenafil (VIAGRA) 50 MG tablet Take 50 mg by mouth as needed.   No facility-administered encounter medications on file as of 11/19/2023.     Today's Vitals   11/19/23 1151 11/19/23 1205  BP: 130/88   Pulse: (!) 54   Resp: 17   Temp: 97.8 F (36.6 C)   SpO2: 99%   Weight: 164 lb 14.4 oz (74.8 kg)   PainSc:  0-No pain   Body mass index is 24.71 kg/m.   ECOG PERFORMANCE STATUS: 0 - Asymptomatic  PHYSICAL EXAM GENERAL:alert, no distress and comfortable SKIN: no rash  EYES: sclera clear LUNGS: clear with normal breathing effort HEART: regular rate & rhythm ABDOMEN: abdomen soft, non-tender and normal bowel sounds NEURO: alert & oriented x 3 with fluent speech, no focal motor deficits   CBC    Latest Ref Rng & Units 11/19/2023   11:41 AM 08/13/2023    1:27 PM 02/09/2023    1:04 PM  CBC  WBC 4.0 - 10.5 K/uL 4.5  4.2  4.5   Hemoglobin 13.0 - 17.0 g/dL 85.7  85.7  85.2   Hematocrit 39.0 - 52.0 % 43.0  41.2  43.1   Platelets 150 - 400 K/uL 144  146  156       CMP     Latest Ref Rng & Units 11/19/2023   11:41 AM 08/13/2023    1:27 PM 02/09/2023    1:04 PM  CMP  Glucose 70 - 99 mg/dL 896  894  889   BUN 8 - 23 mg/dL 13  17  13    Creatinine 0.61 - 1.24 mg/dL 8.97  8.97  9.05   Sodium 135 - 145 mmol/L 139  139  138   Potassium 3.5 - 5.1 mmol/L 5.0  4.1  4.6   Chloride 98 - 111 mmol/L 105  107  105   CO2 22 - 32 mmol/L 29  28  30     Calcium 8.9 - 10.3 mg/dL 9.2  9.0  9.2   Total Protein 6.5 - 8.1 g/dL 6.9  6.9  6.9   Total Bilirubin 0.0 - 1.2 mg/dL 0.7  0.8  0.7   Alkaline Phos 38 - 126 U/L 71  71  73   AST 15 - 41 U/L 19  19  19    ALT 0 - 44 U/L 15  17  17        ASSESSMENT &  PLAN: 83 year old male  Primary appendiceal adenocarcinoma, pT4N0M0, stage II   -diagnosed in 08/2020 --s/p right hemicolectomy on 01/31/21 with Dr. Teresa, pathology from colon and all 26 lymph nodes was negative. -he is on cancer surveillance. - CT CAP from 02/2023 showing a 1 cm hypodensity in the pancreatic head with minimal change in low-density focus at anastomosis with minimal change.  W --CEA normal (but no preop baseline) - Chad Ford is clinically doing well, exam is benign, labs are unremarkable.  I reviewed the surveillance CT from 10/21/2023 which shows no evidence of disease.  He remains in clinical remission     PLAN: - Surveillance scan and today's labs reviewed -Continue cancer surveillance -Follow-up in 6 months, or sooner if needed    All questions were answered. The patient knows to call the clinic with any problems, questions or concerns. No barriers to learning were detected.   Corene Resnick K Khyler Urda, NP 11/19/2023

## 2023-11-20 ENCOUNTER — Inpatient Hospital Stay: Admitting: Nurse Practitioner

## 2023-11-20 ENCOUNTER — Inpatient Hospital Stay

## 2024-05-19 ENCOUNTER — Inpatient Hospital Stay: Admitting: Nurse Practitioner

## 2024-05-19 ENCOUNTER — Inpatient Hospital Stay
